# Patient Record
Sex: Male | Born: 1970
Health system: Southern US, Community
[De-identification: ages and names within clinical notes are randomized; demographics above are authoritative.]

## PROBLEM LIST (undated history)

## (undated) ENCOUNTER — Emergency Department (HOSPITAL_COMMUNITY): Admission: EM | Payer: BC Managed Care – PPO

## (undated) DIAGNOSIS — D689 Coagulation defect, unspecified: Secondary | ICD-10-CM

## (undated) DIAGNOSIS — I10 Essential (primary) hypertension: Secondary | ICD-10-CM

## (undated) DIAGNOSIS — E785 Hyperlipidemia, unspecified: Secondary | ICD-10-CM

## (undated) HISTORY — DX: Essential (primary) hypertension: I10

## (undated) HISTORY — DX: Hyperlipidemia, unspecified: E78.5

## (undated) HISTORY — DX: Coagulation defect, unspecified: D68.9

## (undated) HISTORY — PX: BICEPS TENDON REPAIR: SHX566

---

## 2000-04-18 ENCOUNTER — Encounter: Admission: RE | Admit: 2000-04-18 | Discharge: 2000-04-18 | Payer: Self-pay | Admitting: Family Medicine

## 2000-04-18 ENCOUNTER — Encounter: Payer: Self-pay | Admitting: Family Medicine

## 2001-10-01 HISTORY — PX: KNEE SURGERY: SHX244

## 2003-03-29 ENCOUNTER — Encounter: Payer: Self-pay | Admitting: Emergency Medicine

## 2003-03-29 ENCOUNTER — Emergency Department (HOSPITAL_COMMUNITY): Admission: EM | Admit: 2003-03-29 | Discharge: 2003-03-29 | Payer: Self-pay | Admitting: Emergency Medicine

## 2007-10-22 ENCOUNTER — Ambulatory Visit: Payer: Self-pay | Admitting: Cardiology

## 2007-10-24 ENCOUNTER — Ambulatory Visit: Payer: Self-pay

## 2007-11-18 ENCOUNTER — Encounter: Payer: Self-pay | Admitting: Cardiology

## 2007-11-18 ENCOUNTER — Ambulatory Visit: Payer: Self-pay

## 2011-02-13 NOTE — Assessment & Plan Note (Signed)
[facility]                            CARDIOLOGY OFFICE NOTE   NAME:[name], [name]                      MRN:          [id number]  DATE:10/22/2007                            DOB:          [birth year]    PRIMARY:  Montey Hora, PA at [facility].   REASON FOR PRESENTATION:  Evaluate the patient with chest discomfort and  hypertension.   HISTORY OF PRESENT ILLNESS:  The patient is a pleasant 40 year old  gentleman with an episode of chest discomfort.  This happened a couple  of weeks ago while he was hand-digging a hole on his job.  This is the  most exerting thing he does routinely.  He has not since done that kind  of activity.  He developed substernal chest pressure.  He stopped what  he was doing.  He said the discomfort slowly eased, but he did have some  persistent uncomfortable feeling for about a day.  He has since noticed  a little soreness perhaps when he is doing activities.  He said the  discomfort was about 7/10.  It was not like his previous reflux.  There  was no radiation to his jaw or to his arms.  He was a little light-  headed, but did not describe any excessive shortness of breath or  diaphoresis.  He had no palpitations, presyncope, or syncope.  He denies  any resting shortness of breath or resting complaints.  He has had no  PND or orthopnea.  He does get some arm tingling depending on how he  lies at night.  He is otherwise not exerting himself other than less  intense activities at work.   PAST MEDICAL HISTORY:  He has no history of hypertension (although  readings have been elevated recently).  Diabetes.  Hyperlipidemia.   PAST SURGICAL HISTORY:  None.   ALLERGIES:  None.   MEDICATIONS:  Prilosec.   SOCIAL HISTORY:  The patient is a Copywriter, advertising for [employer].  He is  married.  He has 2 young children.  He does not smoke cigarettes or  drink alcohol.   FAMILY HISTORY:  Contributory for his father  having a myocardial  infarction at age 79.   REVIEW OF SYSTEMS:  As stated in the HPI and positive for reflux.  Negative for other systems.   PHYSICAL EXAMINATION:  The patient is in no distress.  Blood pressure 158/110, heart rate 71 and regular.  Weight 257 pounds,  body mass index 39.  HEENT:  Eyelids unremarkable.  Pupils are equal, round, and reactive to  light and accommodation.  Fundi are within normal limits.  Oral mucosa  unremarkable.  NECK:  No jugular venous distension at 45 degrees, carotid upstroke  brisk and symmetric, no bruits, thyromegaly.  LYMPHATICS:  No cervical, axillary, or inguinal adenopathy.  LUNGS:  Clear to auscultation bilaterally.  BACK:  No costovertebral angle tenderness.  CHEST:  Unremarkable.  HEART:  PMI not displaced or sustained, S1 and S2 within normal limits,  no S3, no S4, no clicks, rubs, murmurs.  ABDOMEN:  Obese, positive bowel sounds,  normal in frequency and pitch,  no bruits, rebound, guarding.  No midline pulsatile masses,  hepatomegaly, splenomegaly.  SKIN:  No rashes, no nodules.  EXTREMITIES:  With 2+ pulses throughout, no edema, cyanosis, clubbing.  NEURO:  Oriented to person, place, and time, cranial nerves 2-12 grossly  intact, motor grossly intact.   EKG shows right bundle branch block (present since 2006), axis within  normal limits, sinus rhythm, no acute ST-T wave changes.   ASSESSMENT AND PLAN:  1. Chest discomfort.  The patient had chest discomfort that was      worrisome for exertional angina.  He does have a strong family      history.  His physical exam is unremarkable.  His EKG shows chronic      right bundle branch block.  At this point, I think the pre-test      probability of obstructive coronary artery disease is somewhat      moderate.  Therefore, he needs screening with exercise perfusion      study.  Further evaluation will be based on these results.  2. Hypertension.  I looked back at multiple blood  pressures, which      have been elevated over the past 1 year to 18 months.  I am going      to go ahead and give him hydrochlorothiazide 12.5 mg daily.  We      discussed increasing potassium intake.  He will get a BMET in 2      weeks.  We have discussed salt restriction and weight loss.  3. Obesity.  We discussed the need to lose weight with diet and      exercise.  4. Right bundle branch block.  This is chronic.  Will screen him for      obstructive coronary disease.  Otherwise, I would not suspect      structural heart disease.  No further workup will be needed.  5. Followup.  I will see him back based on the results of the above.     Rollene Rotunda, MD, [facility]  Electronically Signed    JH/MedQ  DD: 10/22/2007  DT: 10/22/2007  Job #: [id number]   cc:   Montey Hora, PA

## 2012-03-10 ENCOUNTER — Encounter (INDEPENDENT_AMBULATORY_CARE_PROVIDER_SITE_OTHER): Payer: BC Managed Care – PPO | Admitting: Ophthalmology

## 2012-03-10 DIAGNOSIS — H35719 Central serous chorioretinopathy, unspecified eye: Secondary | ICD-10-CM

## 2012-03-10 DIAGNOSIS — H43819 Vitreous degeneration, unspecified eye: Secondary | ICD-10-CM

## 2012-03-31 ENCOUNTER — Encounter (INDEPENDENT_AMBULATORY_CARE_PROVIDER_SITE_OTHER): Payer: BC Managed Care – PPO | Admitting: Ophthalmology

## 2012-03-31 DIAGNOSIS — H43819 Vitreous degeneration, unspecified eye: Secondary | ICD-10-CM

## 2012-03-31 DIAGNOSIS — H35719 Central serous chorioretinopathy, unspecified eye: Secondary | ICD-10-CM

## 2012-03-31 DIAGNOSIS — H2510 Age-related nuclear cataract, unspecified eye: Secondary | ICD-10-CM

## 2012-07-07 ENCOUNTER — Encounter (INDEPENDENT_AMBULATORY_CARE_PROVIDER_SITE_OTHER): Payer: BC Managed Care – PPO | Admitting: Ophthalmology

## 2013-04-07 ENCOUNTER — Other Ambulatory Visit: Payer: Self-pay

## 2013-04-07 MED ORDER — AMLODIPINE BESYLATE 10 MG PO TABS: 10.0000 mg | ORAL_TABLET | Freq: Every day | ORAL | Status: DC

## 2013-04-28 ENCOUNTER — Ambulatory Visit (INDEPENDENT_AMBULATORY_CARE_PROVIDER_SITE_OTHER): Payer: BC Managed Care – PPO | Admitting: Physician Assistant

## 2013-04-28 ENCOUNTER — Encounter: Payer: Self-pay | Admitting: Physician Assistant

## 2013-04-28 VITALS — BP 131/84 | HR 53 | Temp 97.5°F | Wt 265.4 lb

## 2013-04-28 DIAGNOSIS — I10 Essential (primary) hypertension: Secondary | ICD-10-CM | POA: Insufficient documentation

## 2013-04-28 DIAGNOSIS — Z23 Encounter for immunization: Secondary | ICD-10-CM

## 2013-04-28 MED ORDER — AMLODIPINE BESYLATE 10 MG PO TABS: 10.0000 mg | ORAL_TABLET | Freq: Every day | ORAL | Status: DC

## 2013-04-28 NOTE — Patient Instructions (Signed)

## 2013-04-28 NOTE — Progress Notes (Signed)
Subjective:     Patient ID: [name] [name], male   DOB: [birth year], 42 y.o.   MRN: [id number]  HPI Pt here for review of HTN States he has been doing well and taking meds on regular basis No CP, SOB, or lower ext edema No change in endurance   Review of Systems  All other systems reviewed and are negative.       Objective:   Physical Exam  Vitals reviewed. Oral- no lesions No JVD/Bruits Heart- RRR w/o M Lungs- CTA bilat No lower ext edema Full labs pending     Assessment:     HTN    Plan:     Pt with baseline EKG Will inform of lab results Would like him to decrease his weight Meds rf for 6 months Tetanus updated today

## 2013-04-29 LAB — BMP8+EGFR
BUN/Creatinine Ratio: 13 (ref 9–20)
BUN: 15 mg/dL (ref 6–24)
CO2: 24 mmol/L (ref 18–29)
Calcium: 9.9 mg/dL (ref 8.7–10.2)
Chloride: 104 mmol/L (ref 97–108)
Creatinine, Ser: 1.18 mg/dL (ref 0.76–1.27)
GFR calc Af Amer: 87 mL/min/{1.73_m2} (ref >59)
GFR calc non Af Amer: 75 mL/min/{1.73_m2} (ref >59)
Glucose: 85 mg/dL (ref 65–99)
Potassium: 4.4 mmol/L (ref 3.5–5.2)
Sodium: 141 mmol/L (ref 134–144)

## 2013-04-29 LAB — LIPID PANEL
Chol/HDL Ratio: 5.1 ratio units — ABNORMAL HIGH (ref 0.0–5.0)
Cholesterol, Total: 183 mg/dL (ref 100–199)
HDL: 36 mg/dL — ABNORMAL LOW (ref >39)
LDL Calculated: 119 mg/dL — ABNORMAL HIGH (ref 0–99)
Triglycerides: 139 mg/dL (ref 0–149)
VLDL Cholesterol Cal: 28 mg/dL (ref 5–40)

## 2013-04-29 LAB — HEPATIC FUNCTION PANEL
ALT: 22 IU/L (ref 0–44)
AST: 17 IU/L (ref 0–40)
Albumin: 4.4 g/dL (ref 3.5–5.5)
Alkaline Phosphatase: 58 IU/L (ref 39–117)
Bilirubin, Direct: 0.09 mg/dL (ref 0.00–0.40)
Total Bilirubin: 0.4 mg/dL (ref 0.0–1.2)
Total Protein: 6.8 g/dL (ref 6.0–8.5)

## 2013-05-27 ENCOUNTER — Other Ambulatory Visit: Payer: Self-pay

## 2013-05-27 DIAGNOSIS — R0683 Snoring: Secondary | ICD-10-CM

## 2013-06-14 ENCOUNTER — Ambulatory Visit: Payer: BC Managed Care – PPO | Attending: Family Medicine | Admitting: Sleep Medicine

## 2013-06-14 DIAGNOSIS — Z6841 Body Mass Index (BMI) 40.0 and over, adult: Secondary | ICD-10-CM | POA: Insufficient documentation

## 2013-06-14 DIAGNOSIS — R0683 Snoring: Secondary | ICD-10-CM

## 2013-06-14 DIAGNOSIS — G4733 Obstructive sleep apnea (adult) (pediatric): Secondary | ICD-10-CM | POA: Insufficient documentation

## 2013-06-20 NOTE — Procedures (Signed)
[facility] Miquel Stacks A. Gerilyn Pilgrim, MD     [email or web address]        [name], [name] [name]             ACCOUNT NO.:  [id number]  MEDICAL RECORD NO.:  [id number]          PATIENT TYPE:  OUT  LOCATION:  SLEEP LAB                     FACILITY:  [facility]  PHYSICIAN:  Travaughn Vue A. Gerilyn Pilgrim, M.D. DATE OF BIRTH:  [birth year]  DATE OF STUDY:  06/14/2013                           NOCTURNAL POLYSOMNOGRAM  REFERRING PHYSICIAN:  Ernestina Penna, M.D.  INDICATION FOR STUDY:  A 42 year old presents with fatigue and snoring. The study is being done to evaluate for obstructive sleep apnea syndrome.   MEDICATIONS:  Amlodipine.  EPWORTH SLEEPINESS SCALE:  6.  BMI:  40.  SLEEP ARCHITECTURE:  The total recording time is 407 minutes.  Sleep efficiency 96%.  Sleep latency 7 minutes.  REM latency 58 minutes. Stage N1 3%, N2 47%, N3 70%, and REM sleep 33%.  RESPIRATORY DATA:  Baseline oxygen saturation is 97, lowest saturation 85 during REM sleep.  Diagnostic AHI is 10 and RDI is 17.  LIMB MOVEMENT SUMMARY:  PLM index 0.  ELECTROCARDIOGRAM SUMMARY:  Average heart rate is 53.  IMPRESSION:  Mild-to-moderate obstructive sleep apnea syndrome.  RECOMMENDATION:  Formal CPAP titration study.  Thanks for this referral.     Waseem Suess A. Gerilyn Pilgrim, M.D.    KAD/MEDQ  D:  06/20/2013 19:33:06  T:  06/20/2013 19:50:01  Job:  [id number]

## 2013-12-21 ENCOUNTER — Other Ambulatory Visit: Payer: Self-pay | Admitting: Physician Assistant

## 2013-12-22 NOTE — Telephone Encounter (Signed)
Patient NTBS for follow up and lab work in order to get future refills

## 2014-03-29 ENCOUNTER — Other Ambulatory Visit: Payer: Self-pay | Admitting: Nurse Practitioner

## 2014-03-30 NOTE — Telephone Encounter (Signed)
Last seen 04/28/14 [facility]

## 2014-03-30 NOTE — Telephone Encounter (Signed)
Pt needs appt May rf x 1

## 2014-05-05 ENCOUNTER — Other Ambulatory Visit: Payer: Self-pay | Admitting: Physician Assistant

## 2014-05-10 ENCOUNTER — Encounter (INDEPENDENT_AMBULATORY_CARE_PROVIDER_SITE_OTHER): Payer: Self-pay

## 2014-05-10 ENCOUNTER — Encounter: Payer: Self-pay | Admitting: Nurse Practitioner

## 2014-05-10 ENCOUNTER — Encounter: Payer: BC Managed Care – PPO | Admitting: Nurse Practitioner

## 2014-05-10 LAB — POCT URINALYSIS DIPSTICK
BILIRUBIN UA: NEGATIVE
GLUCOSE UA: NEGATIVE
Ketones, UA: NEGATIVE
LEUKOCYTES UA: NEGATIVE
NITRITE UA: NEGATIVE
Protein, UA: NEGATIVE
Spec Grav, UA: 1.025
UROBILINOGEN UA: NEGATIVE
pH, UA: 5

## 2014-05-10 NOTE — Progress Notes (Addendum)
   Subjective:    Patient ID: [name] FullingJeffrey [name], male    DOB: Oct 12, 1970, 43 y.o.   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    Review of Systems     Objective:   Physical Exam        Assessment & Plan:  Erroneous encounter

## 2014-05-14 ENCOUNTER — Encounter: Payer: Self-pay | Admitting: Nurse Practitioner

## 2014-05-14 ENCOUNTER — Ambulatory Visit (INDEPENDENT_AMBULATORY_CARE_PROVIDER_SITE_OTHER): Payer: BC Managed Care – PPO | Admitting: Nurse Practitioner

## 2014-05-14 VITALS — BP 138/87 | HR 77 | Temp 97.8°F | Ht 69.0 in | Wt 270.0 lb

## 2014-05-14 DIAGNOSIS — Z0289 Encounter for other administrative examinations: Secondary | ICD-10-CM

## 2014-05-14 DIAGNOSIS — G4733 Obstructive sleep apnea (adult) (pediatric): Secondary | ICD-10-CM | POA: Insufficient documentation

## 2014-05-14 NOTE — Progress Notes (Signed)
Commercial Driver Medical Examination   [name] [name] is a 43 y.o. male who presents today for a commercial driver fitness determination physical exam. The patient reports no problems. The following portions of the patient's history were reviewed and updated as appropriate: allergies, current medications, past family history, past medical history, past social history, past surgical history and problem list.  Illness or injury last 5 years? no Head/Brain injuries, disorders or illnesses? no Seizures? no Eye disorders or impaired vision?no Ear disorders or loss of hearing or balance?no Heart disease, heart attack or other cardiovascular conditions?no Heart surgery ( valve replacement, bypass or angioplasty?no Pacemaker?no High blood pressure?yes Muscular disease?no Shortness of breath?no Lung disease, emphysema, asthma, chronic bronchitis?no Kidney disease, dialysis?no Liver disease?no Digestive problems?no Diabetes?no Nervous or psychiatric disorder/depression?no Loss of ,or altered mental status?no Fainting/ dizziness?no Sleep disorder, loud snoring, daytime sleepiness?yes Sleep apnea with 99% usage Stroke or paralysis?no Missing or impaired hand, arm, foot, leg fingers or toes?no Spinal injury or disease?no Chronic low back pain?no Regular or frequent alcohol use?no Narcotic or habit forming drug use?no  Outpatient Encounter Prescriptions as of 05/14/2014  Medication Sig  . amLODipine (NORVASC) 10 MG tablet TAKE 1 TABLET (10 MG TOTAL) BY MOUTH DAILY.    Review of Systems A comprehensive review of systems was negative.   Objective:    Vision:  Uncorrected Corrected Horizontal Field of Vision  Right Eye  20/20 >70 degrees  Left Eye   20/20 >70 degrees  Both Eyes   20/20    Applicant can recognize and distinguish among traffic control signals and devices showing standard red, green, and amber colors.  Applicant meets visual acuity requirement only when wearing  corrective lenses.  Monocular Vision?: No   Hearing:Positive whisper test at >5 ft   BP 138/87  Pulse 77  Temp(Src) 97.8 F (36.6 C) (Oral)  Ht 5\' 9"  (1.753 m)  Wt 270 lb (122.471 kg)  BMI 39.85 kg/m2  General Appearance:    Alert, cooperative, no distress, appears stated age  Head:    Normocephalic, without obvious abnormality, atraumatic  Eyes:    PERRL, conjunctiva/corneas clear, EOM's intact, fundi    benign, both eyes       Ears:    Normal TM's and external ear canals, both ears  Nose:   Nares normal, septum midline, mucosa normal, no drainage    or sinus tenderness  Throat:   Lips, mucosa, and tongue normal; teeth and gums normal  Neck:   Supple, symmetrical, trachea midline, no adenopathy;       thyroid:  No enlargement/tenderness/nodules; no carotid   bruit or JVD  Back:     Symmetric, no curvature, ROM normal, no CVA tenderness  Lungs:     Clear to auscultation bilaterally, respirations unlabored  Chest wall:    No tenderness or deformity  Heart:    Regular rate and rhythm, S1 and S2 normal, no murmur, rub   or gallop  Abdomen:     Soft, non-tender, bowel sounds active all four quadrants,    no masses, no organomegaly  Genitalia:    Normal male without lesion, discharge or tenderness  Rectal:    Normal tone, normal prostate, no masses or tenderness;   guaiac negative stool  Extremities:   Extremities normal, atraumatic, no cyanosis or edema  Pulses:   2+ and symmetric all extremities  Skin:   Skin color, texture, turgor normal, no rashes or lesions  Lymph nodes:   Cervical, supraclavicular, and axillary nodes normal  Neurologic:   CNII-XII intact. Normal strength, sensation and reflexes      throughout    Labs: Lab Results  Component Value Date   SPECGRAV 1.025 05/10/2014   PROTEINUR neg 05/10/2014   BILIRUBINUR neg 05/10/2014      Assessment:    Healthy male exam.  Meets standards, but periodic monitoring required due to Hypertension and sleep apnea.   Driver qualified only for 1 year.    Plan:    Medical examiners certificate completed and printed. Return as needed.   Mary-Margaret Daphine DeutscherMartin, FNP

## 2014-05-14 NOTE — Patient Instructions (Signed)
Medical Examiner's Certificate  I certify that I have examined [name] FullingJeffrey [name]. In accordance with the [employer] Safety Regulations [phone](49 CFR [id number]) and with knowledge of the driving duties, I find this person is qualified; and, if applicable, only when:     Wearing corrective lenses The information I have provided regarding this physical examination is true and complete. A complete examination form with any attachment embodies my findings completely and correctly, and is on file in my office.   _________________________________________ Bennie PieriniMARTIN,MARY MARGARET 05/14/2014  __________________________________________ _________[id number]__________ __[place]_  Signature of Driver Driver's License No. State    Address of Driver [id number] [place] [id number]  Medical Certificate Expiration Date 05/15/15

## 2014-06-07 ENCOUNTER — Other Ambulatory Visit: Payer: Self-pay | Admitting: Family Medicine

## 2014-12-16 ENCOUNTER — Other Ambulatory Visit: Payer: Self-pay | Admitting: Nurse Practitioner

## 2015-01-17 ENCOUNTER — Other Ambulatory Visit: Payer: Self-pay | Admitting: Nurse Practitioner

## 2015-01-17 NOTE — Telephone Encounter (Signed)
Has an appt with you this month MMM

## 2015-01-24 ENCOUNTER — Ambulatory Visit (INDEPENDENT_AMBULATORY_CARE_PROVIDER_SITE_OTHER): Payer: BLUE CROSS/BLUE SHIELD | Admitting: Nurse Practitioner

## 2015-01-24 ENCOUNTER — Encounter: Payer: Self-pay | Admitting: Nurse Practitioner

## 2015-01-24 VITALS — BP 123/83 | HR 64 | Temp 97.2°F | Ht 69.0 in | Wt 278.0 lb

## 2015-01-24 DIAGNOSIS — I10 Essential (primary) hypertension: Secondary | ICD-10-CM | POA: Diagnosis not present

## 2015-01-24 MED ORDER — AMLODIPINE BESYLATE 10 MG PO TABS: ORAL_TABLET | ORAL | Status: DC

## 2015-01-24 NOTE — Progress Notes (Signed)
   Subjective:    Patient ID: [name] [name], male    DOB: March 08, 1971, 44 y.o.   MRN: [id number]  HPI Patient in today to recheck hypertension- His sister had taken his blood pressure and it was 160 /90. 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 He is feeling good.    Review of Systems  Constitutional: Negative for fever and chills.  HENT: Negative.   Respiratory: Negative for cough, chest tightness and shortness of breath.   Cardiovascular: Negative for chest pain, palpitations and leg swelling.  Gastrointestinal: Negative.   Genitourinary: Negative.   Neurological: Negative.   Psychiatric/Behavioral: Negative.   All other systems reviewed and are negative.      Objective:   Physical Exam  Constitutional: He is oriented to person, place, and time. He appears well-developed and well-nourished.  HENT:  Head: Normocephalic.  Right Ear: External ear normal.  Left Ear: External ear normal.  Nose: Nose normal.  Mouth/Throat: Oropharynx is clear and moist.  Eyes: EOM are normal. Pupils are equal, round, and reactive to light.  Neck: Normal range of motion. Neck supple. No JVD present. No thyromegaly present.  Cardiovascular: Normal rate, regular rhythm, normal heart sounds and intact distal pulses.  Exam reveals no gallop and no friction rub.   No murmur heard. Pulmonary/Chest: Effort normal and breath sounds normal. No respiratory distress. He has no wheezes. He has no rales. He exhibits no tenderness.  Abdominal: Soft. Bowel sounds are normal. He exhibits no mass. There is no tenderness.  Musculoskeletal: Normal range of motion. He exhibits no edema.  Lymphadenopathy:    He has no cervical adenopathy.  Neurological: He is alert and oriented to person, place, and time. No cranial nerve deficit.  Skin: Skin is warm and dry.  Psychiatric: He has a normal mood and affect. His behavior is normal. Judgment and  thought content normal.    BP 123/83 mmHg  Pulse 64  Temp(Src) 97.2 F (36.2 C) (Oral)  Ht $R'5\' 9"'Wp$  (1.753 m)  Wt 278 lb (126.1 kg)  BMI 41.03 kg/m2       Assessment & Plan:  1. Essential hypertension do not add slat to diet Keep diary of blood pressures - CMP14+EGFR - NMR, lipoprofile - amLODipine (NORVASC) 10 MG tablet; TAKE 1 TABLET (10 MG TOTAL) BY MOUTH DAILY. MUST BE SEEN  Dispense: 30 tablet; Refill: 5    Labs pending Health maintenance reviewed Diet and exercise encouraged Continue all meds Follow up  In 1 month   Young Harris, FNP

## 2015-01-24 NOTE — Patient Instructions (Signed)

## 2015-01-25 ENCOUNTER — Other Ambulatory Visit: Payer: Self-pay | Admitting: Nurse Practitioner

## 2015-01-25 LAB — CMP14+EGFR
A/G RATIO: 1.8 (ref 1.1–2.5)
ALT: 23 IU/L (ref 0–44)
AST: 14 IU/L (ref 0–40)
Albumin: 4.5 g/dL (ref 3.5–5.5)
Alkaline Phosphatase: 59 IU/L (ref 39–117)
BILIRUBIN TOTAL: 0.3 mg/dL (ref 0.0–1.2)
BUN / CREAT RATIO: 14 (ref 9–20)
BUN: 14 mg/dL (ref 6–24)
CALCIUM: 9.3 mg/dL (ref 8.7–10.2)
CHLORIDE: 105 mmol/L (ref 97–108)
CO2: 24 mmol/L (ref 18–29)
CREATININE: 0.99 mg/dL (ref 0.76–1.27)
GFR calc Af Amer: 107 mL/min/{1.73_m2} (ref >59)
GFR calc non Af Amer: 92 mL/min/{1.73_m2} (ref >59)
GLOBULIN, TOTAL: 2.5 g/dL (ref 1.5–4.5)
Glucose: 85 mg/dL (ref 65–99)
POTASSIUM: 4.7 mmol/L (ref 3.5–5.2)
Sodium: 143 mmol/L (ref 134–144)
TOTAL PROTEIN: 7 g/dL (ref 6.0–8.5)

## 2015-01-25 LAB — NMR, LIPOPROFILE
Cholesterol: 174 mg/dL (ref 100–199)
HDL Cholesterol by NMR: 35 mg/dL — ABNORMAL LOW (ref >39)
HDL Particle Number: 29.4 umol/L — ABNORMAL LOW (ref >=30.5)
LDL PARTICLE NUMBER: 1566 nmol/L — AB (ref <1000)
LDL SIZE: 20.1 nm (ref >20.5)
LDL-C: 113 mg/dL — ABNORMAL HIGH (ref 0–99)
LP-IR Score: 89 — ABNORMAL HIGH (ref <=45)
SMALL LDL PARTICLE NUMBER: 1000 nmol/L — AB (ref <=527)
Triglycerides by NMR: 131 mg/dL (ref 0–149)

## 2015-01-25 MED ORDER — ATORVASTATIN CALCIUM 40 MG PO TABS: 40.0000 mg | ORAL_TABLET | Freq: Every day | ORAL | Status: DC

## 2015-02-24 ENCOUNTER — Ambulatory Visit (INDEPENDENT_AMBULATORY_CARE_PROVIDER_SITE_OTHER): Payer: BLUE CROSS/BLUE SHIELD | Admitting: Nurse Practitioner

## 2015-02-24 ENCOUNTER — Encounter: Payer: Self-pay | Admitting: Nurse Practitioner

## 2015-02-24 VITALS — BP 116/78 | HR 60 | Temp 97.4°F | Ht 69.0 in | Wt 284.0 lb

## 2015-02-24 DIAGNOSIS — I10 Essential (primary) hypertension: Secondary | ICD-10-CM | POA: Diagnosis not present

## 2015-02-24 MED ORDER — AMLODIPINE BESY-BENAZEPRIL HCL 5-20 MG PO CAPS: 1.0000 | ORAL_CAPSULE | Freq: Every day | ORAL | Status: DC

## 2015-02-24 NOTE — Patient Instructions (Signed)

## 2015-02-24 NOTE — Progress Notes (Signed)
   Subjective:    Patient ID: [name] [name], male    DOB: 1971-03-05, 44 y.o.   MRN: [id number]  HPI Patient was seen 01/24/15 with hypertension and was started on amlodipine. Blood pressure at home has been running from 120-150'2 systolic. He denies in side effects from medication.    Review of Systems  Constitutional: Negative.   HENT: Negative.   Respiratory: Negative.   Cardiovascular: Negative.   Gastrointestinal: Negative.   Genitourinary: Negative.   Neurological: Negative.  Negative for headaches.  Psychiatric/Behavioral: Negative.   All other systems reviewed and are negative.      Objective:   Physical Exam  Constitutional: He is oriented to person, place, and time. He appears well-developed and well-nourished. No distress.  Cardiovascular: Normal rate, regular rhythm and normal heart sounds.   Pulmonary/Chest: Effort normal and breath sounds normal.  Neurological: He is alert and oriented to person, place, and time.  Skin: Skin is warm.  Psychiatric: He has a normal mood and affect. His behavior is normal. Judgment and thought content normal.    BP 116/78 mmHg  Pulse 60  Temp(Src) 97.4 F (36.3 C) (Oral)  Ht 5\' 9"  (1.753 m)  Wt 284 lb (128.822 kg)  BMI 41.92 kg/m2       Assessment & Plan:  1. Essential hypertension Do not add slat to diet Keep diary of blood pressures DOT physical in 1 month - amLODipine-benazepril (LOTREL) 5-20 MG per capsule; Take 1 capsule by mouth daily.  Dispense: 30 capsule; Refill: 5  Mary-Margaret Daphine DeutscherMartin, FNP

## 2015-04-12 ENCOUNTER — Ambulatory Visit: Payer: BLUE CROSS/BLUE SHIELD | Admitting: Nurse Practitioner

## 2015-04-21 ENCOUNTER — Ambulatory Visit (INDEPENDENT_AMBULATORY_CARE_PROVIDER_SITE_OTHER): Payer: BLUE CROSS/BLUE SHIELD | Admitting: Nurse Practitioner

## 2015-04-21 ENCOUNTER — Encounter: Payer: Self-pay | Admitting: Nurse Practitioner

## 2015-04-21 VITALS — BP 116/76 | HR 60 | Temp 97.1°F | Ht 69.0 in | Wt 279.0 lb

## 2015-04-21 DIAGNOSIS — I10 Essential (primary) hypertension: Secondary | ICD-10-CM

## 2015-04-21 NOTE — Patient Instructions (Signed)

## 2015-04-21 NOTE — Progress Notes (Signed)
   Subjective:    Patient ID: [name] [name], male    DOB: 05/29/1971, 44 y.o.   MRN: [id number]  HPI Patient was seen 5//26/16 with elevated blood pressure- at time he was on amlodipine- blood pressure was still running high so we changed him to lotrel 5/20. Patient says that he is feeling fine. No side effects from medication.    Review of Systems  Constitutional: Negative.   HENT: Negative.   Respiratory: Negative.   Cardiovascular: Negative.   Gastrointestinal: Negative.   Genitourinary: Negative.   Neurological: Negative.   Psychiatric/Behavioral: Negative.   All other systems reviewed and are negative.      Objective:   Physical Exam  Constitutional: He is oriented to person, place, and time. He appears well-developed and well-nourished.  HENT:  Head: Normocephalic.  Right Ear: External ear normal.  Left Ear: External ear normal.  Nose: Nose normal.  Mouth/Throat: Oropharynx is clear and moist.  Eyes: EOM are normal. Pupils are equal, round, and reactive to light.  Neck: Normal range of motion. Neck supple. No JVD present. No thyromegaly present.  Cardiovascular: Normal rate, regular rhythm, normal heart sounds and intact distal pulses.  Exam reveals no gallop and no friction rub.   No murmur heard. Pulmonary/Chest: Effort normal and breath sounds normal. No respiratory distress. He has no wheezes. He has no rales. He exhibits no tenderness.  Abdominal: Soft. Bowel sounds are normal. He exhibits no mass. There is no tenderness.  Genitourinary: Prostate normal and penis normal.  Musculoskeletal: Normal range of motion. He exhibits no edema.  Lymphadenopathy:    He has no cervical adenopathy.  Neurological: He is alert and oriented to person, place, and time. No cranial nerve deficit.  Skin: Skin is warm and dry.  Psychiatric: He has a normal mood and affect. His behavior is normal. Judgment and thought content normal.   BP 116/76 mmHg  Pulse 60  Temp(Src) 97.1 F  (36.2 C) (Oral)  Ht  (1.753 m)  Wt 279 lb (126.554 kg)  BMI 41.18 kg/m2        Assessment & Plan:   1. Essential hypertension    Continue current meds Recheck in 3 months  Mary-Margaret Daphine Deutscher, FNP

## 2015-05-02 ENCOUNTER — Ambulatory Visit (INDEPENDENT_AMBULATORY_CARE_PROVIDER_SITE_OTHER): Payer: BLUE CROSS/BLUE SHIELD | Admitting: Nurse Practitioner

## 2015-05-02 ENCOUNTER — Encounter: Payer: Self-pay | Admitting: Nurse Practitioner

## 2015-05-02 VITALS — BP 125/87 | HR 66 | Temp 96.8°F | Ht 69.0 in | Wt 275.4 lb

## 2015-05-02 DIAGNOSIS — Z024 Encounter for examination for driving license: Secondary | ICD-10-CM

## 2015-05-02 LAB — POCT URINALYSIS DIPSTICK
BILIRUBIN UA: NEGATIVE
Blood, UA: NEGATIVE
Glucose, UA: NEGATIVE
Ketones, UA: NEGATIVE
LEUKOCYTES UA: NEGATIVE
NITRITE UA: NEGATIVE
PROTEIN UA: NEGATIVE
SPEC GRAV UA: 1.015
Urobilinogen, UA: NEGATIVE
pH, UA: 6

## 2015-05-02 NOTE — Progress Notes (Signed)
DOT physical see attached physical

## 2015-07-18 ENCOUNTER — Encounter: Payer: Self-pay | Admitting: Family Medicine

## 2015-07-18 ENCOUNTER — Ambulatory Visit (INDEPENDENT_AMBULATORY_CARE_PROVIDER_SITE_OTHER): Payer: BLUE CROSS/BLUE SHIELD | Admitting: Family Medicine

## 2015-07-18 VITALS — BP 120/81 | HR 76 | Temp 99.4°F | Ht 69.0 in | Wt 282.6 lb

## 2015-07-18 DIAGNOSIS — H66002 Acute suppurative otitis media without spontaneous rupture of ear drum, left ear: Secondary | ICD-10-CM

## 2015-07-18 DIAGNOSIS — J069 Acute upper respiratory infection, unspecified: Secondary | ICD-10-CM | POA: Diagnosis not present

## 2015-07-18 LAB — POCT INFLUENZA A/B
Influenza A, POC: NEGATIVE
Influenza B, POC: NEGATIVE

## 2015-07-18 MED ORDER — MOMETASONE FUROATE 50 MCG/ACT NA SUSP: 1.0000 | Freq: Two times a day (BID) | NASAL | Status: DC

## 2015-07-18 MED ORDER — AZITHROMYCIN 250 MG PO TABS: ORAL_TABLET | ORAL | Status: DC

## 2015-07-18 NOTE — Progress Notes (Signed)
BP 120/81 mmHg  Pulse 76  Temp(Src) 99.4 F (37.4 C) (Oral)  Ht  (1.753 m)  Wt 282 lb 9.6 oz (128.187 kg)  BMI 41.71 kg/m2   Subjective:    Patient ID: [name] [name], male    DOB: 06-Jul-1971, 44 y.o.   MRN: [id number]  HPI: [name] [name] is a 44 y.o. male presenting on 07/18/2015 for Cough; chest congestion; and sinus congestion   HPI Congestion and cough Patient has a one-week history of nasal congestion, sinus pressure, cough, sore throat, chills and coughing spells. He denies any fevers but does admit to having chills. Started with nasal congestion and postnasal drainage and then progressed from there. He denies any sick contacts or any major seasonal allergies. He has not had his flu shot yet this year.  Relevant past medical, surgical, family and social history reviewed and updated as indicated. Interim medical history since our last visit reviewed. Allergies and medications reviewed and updated.  Review of Systems  Constitutional: Negative for fever and chills.  HENT: Positive for congestion, ear pain, postnasal drip, rhinorrhea, sinus pressure, sneezing and sore throat. Negative for ear discharge and voice change.   Eyes: Negative for pain, discharge, redness and visual disturbance.  Respiratory: Positive for cough. Negative for chest tightness, shortness of breath and wheezing.   Cardiovascular: Negative for chest pain and leg swelling.  Gastrointestinal: Negative for abdominal pain, diarrhea and constipation.  Genitourinary: Negative for difficulty urinating.  Musculoskeletal: Negative for back pain and gait problem.  Skin: Negative for rash.  Neurological: Negative for syncope, light-headedness and headaches.  All other systems reviewed and are negative.   Per HPI unless specifically indicated above     Medication List       This list is accurate as of: 07/18/15 11:10 AM.  Always use your most recent med list.               amLODipine-benazepril  5-20 MG capsule  Commonly known as:  LOTREL  Take 1 capsule by mouth daily.     atorvastatin 40 MG tablet  Commonly known as:  LIPITOR  Take 1 tablet (40 mg total) by mouth daily.     dextromethorphan-guaiFENesin 30-600 MG 12hr tablet  Commonly known as:  MUCINEX DM  Take 1 tablet by mouth 2 (two) times daily.     mometasone 50 MCG/ACT nasal spray  Commonly known as:  NASONEX  Place 1 spray into the nose 2 (two) times daily.           Objective:    BP 120/81 mmHg  Pulse 76  Temp(Src) 99.4 F (37.4 C) (Oral)  Ht  (1.753 m)  Wt 282 lb 9.6 oz (128.187 kg)  BMI 41.71 kg/m2  Wt Readings from Last 3 Encounters:  07/18/15 282 lb 9.6 oz (128.187 kg)  05/02/15 275 lb 6.4 oz (124.921 kg)  04/21/15 279 lb (126.554 kg)    Physical Exam  Constitutional: He is oriented to person, place, and time. He appears well-developed and well-nourished. No distress.  HENT:  Right Ear: Tympanic membrane, external ear and ear canal normal.  Left Ear: External ear and ear canal normal. Tympanic membrane is injected, erythematous and bulging. Tympanic membrane is not scarred and not perforated. A middle ear effusion is present.  Nose: Mucosal edema and rhinorrhea present. No sinus tenderness. No epistaxis. Right sinus exhibits maxillary sinus tenderness. Right sinus exhibits no frontal sinus tenderness. Left sinus exhibits maxillary sinus tenderness. Left sinus exhibits no frontal  sinus tenderness.  Mouth/Throat: Uvula is midline and mucous membranes are normal. Posterior oropharyngeal edema and posterior oropharyngeal erythema present. No oropharyngeal exudate or tonsillar abscesses.  Eyes: Conjunctivae and EOM are normal. Pupils are equal, round, and reactive to light. Right eye exhibits no discharge. No scleral icterus.  Cardiovascular: Normal rate, regular rhythm, normal heart sounds and intact distal pulses.   No murmur heard. Pulmonary/Chest: Effort normal and breath sounds normal. No  respiratory distress. He has no wheezes.  Abdominal: He exhibits no distension.  Musculoskeletal: Normal range of motion. He exhibits no edema.  Neurological: He is alert and oriented to person, place, and time. Coordination normal.  Skin: Skin is warm and dry. No rash noted. He is not diaphoretic.  Psychiatric: He has a normal mood and affect. His behavior is normal.  Vitals reviewed.   Results for orders placed or performed in visit on 05/02/15  POCT urinalysis dipstick  Result Value Ref Range   Color, UA gold    Clarity, UA clear    Glucose, UA neg    Bilirubin, UA neg    Ketones, UA neg    Spec Grav, UA 1.015    Blood, UA neg    pH, UA 6.0    Protein, UA neg    Urobilinogen, UA negative    Nitrite, UA neg    Leukocytes, UA Negative Negative      Assessment & Plan:   Problem List Items Addressed This Visit    None    Visit Diagnoses    Acute upper respiratory infection    -  Primary    Relevant Medications    mometasone (NASONEX) 50 MCG/ACT nasal spray    azithromycin (ZITHROMAX) 250 MG tablet    Other Relevant Orders    POCT Influenza A/B    Acute suppurative otitis media of left ear without spontaneous rupture of tympanic membrane, recurrence not specified        Relevant Medications    azithromycin (ZITHROMAX) 250 MG tablet        Follow up plan: Return in about 3 months (around 10/18/2015), or if symptoms worsen or fail to improve, for BP and Cholesterol recheck.  Arville CareJoshua Karsyn Jamie, MD [facility] 07/18/2015, 11:10 AM

## 2015-07-27 ENCOUNTER — Other Ambulatory Visit: Payer: Self-pay | Admitting: Nurse Practitioner

## 2015-07-27 NOTE — Telephone Encounter (Signed)
Last seen 07/18/15  Dr Dettinger  Last lipid 01/24/15

## 2015-09-02 ENCOUNTER — Other Ambulatory Visit: Payer: Self-pay

## 2015-09-02 DIAGNOSIS — I10 Essential (primary) hypertension: Secondary | ICD-10-CM

## 2015-09-02 MED ORDER — AMLODIPINE BESY-BENAZEPRIL HCL 5-20 MG PO CAPS: 1.0000 | ORAL_CAPSULE | Freq: Every day | ORAL | Status: DC

## 2015-09-19 ENCOUNTER — Emergency Department (HOSPITAL_COMMUNITY)
Admission: EM | Admit: 2015-09-19 | Discharge: 2015-09-19 | Disposition: A | Discharge status: Home / Self Care | Payer: BLUE CROSS/BLUE SHIELD | Attending: Emergency Medicine | Admitting: Emergency Medicine

## 2015-09-19 ENCOUNTER — Emergency Department (HOSPITAL_COMMUNITY): Payer: BLUE CROSS/BLUE SHIELD

## 2015-09-19 ENCOUNTER — Encounter (HOSPITAL_COMMUNITY): Payer: Self-pay | Admitting: Emergency Medicine

## 2015-09-19 ENCOUNTER — Ambulatory Visit (INDEPENDENT_AMBULATORY_CARE_PROVIDER_SITE_OTHER): Payer: BLUE CROSS/BLUE SHIELD | Admitting: Pediatrics

## 2015-09-19 ENCOUNTER — Encounter: Payer: Self-pay | Admitting: Pediatrics

## 2015-09-19 VITALS — BP 129/83 | HR 78 | Temp 97.1°F | Ht 69.0 in | Wt 292.6 lb

## 2015-09-19 DIAGNOSIS — Z79899 Other long term (current) drug therapy: Secondary | ICD-10-CM | POA: Insufficient documentation

## 2015-09-19 DIAGNOSIS — E785 Hyperlipidemia, unspecified: Secondary | ICD-10-CM | POA: Diagnosis not present

## 2015-09-19 DIAGNOSIS — R0789 Other chest pain: Secondary | ICD-10-CM

## 2015-09-19 DIAGNOSIS — G4733 Obstructive sleep apnea (adult) (pediatric): Secondary | ICD-10-CM | POA: Diagnosis not present

## 2015-09-19 DIAGNOSIS — R079 Chest pain, unspecified: Secondary | ICD-10-CM

## 2015-09-19 DIAGNOSIS — R0600 Dyspnea, unspecified: Secondary | ICD-10-CM | POA: Insufficient documentation

## 2015-09-19 DIAGNOSIS — I10 Essential (primary) hypertension: Secondary | ICD-10-CM | POA: Insufficient documentation

## 2015-09-19 LAB — CBC WITH DIFFERENTIAL/PLATELET
BASOS ABS: 0 10*3/uL (ref 0.0–0.1)
BASOS PCT: 0 %
EOS PCT: 3 %
Eosinophils Absolute: 0.2 10*3/uL (ref 0.0–0.7)
HEMATOCRIT: 43 % (ref 39.0–52.0)
Hemoglobin: 14.7 g/dL (ref 13.0–17.0)
Lymphocytes Relative: 29 %
Lymphs Abs: 2.2 10*3/uL (ref 0.7–4.0)
MCH: 29.7 pg (ref 26.0–34.0)
MCHC: 34.2 g/dL (ref 30.0–36.0)
MCV: 86.9 fL (ref 78.0–100.0)
MONO ABS: 0.7 10*3/uL (ref 0.1–1.0)
Monocytes Relative: 9 %
NEUTROS ABS: 4.5 10*3/uL (ref 1.7–7.7)
Neutrophils Relative %: 59 %
PLATELETS: 186 10*3/uL (ref 150–400)
RBC: 4.95 MIL/uL (ref 4.22–5.81)
RDW: 13.6 % (ref 11.5–15.5)
WBC: 7.6 10*3/uL (ref 4.0–10.5)

## 2015-09-19 LAB — BASIC METABOLIC PANEL
Anion gap: 9 (ref 5–15)
BUN: 13 mg/dL (ref 6–20)
CALCIUM: 9.4 mg/dL (ref 8.9–10.3)
CHLORIDE: 104 mmol/L (ref 101–111)
CO2: 25 mmol/L (ref 22–32)
CREATININE: 1.11 mg/dL (ref 0.61–1.24)
Glucose, Bld: 92 mg/dL (ref 65–99)
Potassium: 4.2 mmol/L (ref 3.5–5.1)
SODIUM: 138 mmol/L (ref 135–145)

## 2015-09-19 LAB — D-DIMER, QUANTITATIVE (NOT AT ARMC): D DIMER QUANT: 0.52 ug{FEU}/mL — AB (ref 0.00–0.50)

## 2015-09-19 LAB — I-STAT TROPONIN, ED: TROPONIN I, POC: 0 ng/mL (ref 0.00–0.08)

## 2015-09-19 MED ORDER — IOHEXOL 350 MG/ML SOLN
125.0000 mL | Freq: Once | INTRAVENOUS | Status: AC | PRN
  Administered 2015-09-19: 125 mL via INTRAVENOUS

## 2015-09-19 MED ORDER — KETOROLAC TROMETHAMINE 30 MG/ML IJ SOLN
30.0000 mg | Freq: Once | INTRAMUSCULAR | Status: AC
  Administered 2015-09-19: 30 mg via INTRAVENOUS
  Filled 2015-09-19: qty 1

## 2015-09-19 MED ORDER — ASPIRIN 81 MG PO CHEW
324.0000 mg | CHEWABLE_TABLET | Freq: Once | ORAL | Status: AC
  Administered 2015-09-19: 324 mg via ORAL
  Filled 2015-09-19: qty 4

## 2015-09-19 NOTE — ED Provider Notes (Signed)
CSN: [id number]     Arrival date & time 09/19/15  1730 History   First MD Initiated Contact with Patient 09/19/15 1737     Chief Complaint  Patient presents with  . Chest Pain     (Consider location/radiation/quality/duration/timing/severity/associated sxs/prior Treatment) Patient is a 44 y.o. male presenting with chest pain. The history is provided by the patient.  Chest Pain He has been having dull, left-sided chest pain for the last 4 days. Pain waxes and wanes. He rates the pain at 3/10 currently, but will get as high as 6/10. It is worse if he presses on the left side of his stressed and somewhat worse if he takes a deep breath. It is significantly worse if he coughs. There is also some increase in pain with twisting and turning. 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 He does spend a large amount of time during the day in the truck but states that he does get out frequently. There is associated dyspnea and dyspnea slightly worse than his baseline. There is no associated nausea, vomiting, diaphoresis. He does have cardiac risk factors of obesity, hypertension, hyperlipidemia, family history of premature coronary atherosclerosis (father had first MI at age 45). He is a nonsmoker without history of diabetes. He does have obstructive sleep apnea and uses CPAP at night.  Past Medical History  Diagnosis Date  . Hypertension   . Hyperlipidemia    Past Surgical History  Procedure Laterality Date  . Knee surgery Left 2003  . Biceps tendon repair     Family History  Problem Relation Age of Onset  . Cancer Mother     breast cancer  . Heart attack Father    Social History  Substance Use Topics  . Smoking status: Never Smoker   . Smokeless tobacco: None  . Alcohol Use: No    Review of Systems  Cardiovascular: Positive for chest pain.  All other systems reviewed and are negative.     Allergies  Review of patient's allergies indicates no known allergies.  Home  Medications   Prior to Admission medications   Medication Sig Start Date End Date Taking? Authorizing Provider  amLODipine-benazepril (LOTREL) 5-20 MG capsule Take 1 capsule by mouth daily. 09/02/15   Elige RadonJoshua A Dettinger, MD  atorvastatin (LIPITOR) 40 MG tablet TAKE 1 TABLET (40 MG TOTAL) BY MOUTH DAILY. 07/27/15   Elige RadonJoshua A Dettinger, MD   There were no vitals taken for this visit. Physical Exam  Nursing note and vitals reviewed.  44 year old male, resting comfortably and in no acute distress. Vital signs are normal. Oxygen saturation is 99%, which is normal. Head is normocephalic and atraumatic. PERRLA, EOMI. Oropharynx is clear. Neck is nontender and supple without adenopathy or JVD. Back is nontender and there is no CVA tenderness. Lungs are clear without rales, wheezes, or rhonchi. Chest is mildly to moderately tender on the left side which does reproduce his pain. Heart has regular rate and rhythm without murmur. Abdomen is soft, flat, nontender without masses or hepatosplenomegaly and peristalsis is normoactive. Extremities have no cyanosis or edema, full range of motion is present. Skin is warm and dry without rash. Neurologic: Mental status is normal, cranial nerves are intact, there are no motor or sensory deficits.  ED Course  Procedures (including critical care time) Labs Review Results for orders placed or performed during the hospital encounter of 09/19/15  Basic metabolic panel  Result Value Ref Range   Sodium 138 135 -  145 mmol/L   Potassium 4.2 3.5 - 5.1 mmol/L   Chloride 104 101 - 111 mmol/L   CO2 25 22 - 32 mmol/L   Glucose, Bld 92 65 - 99 mg/dL   BUN 13 6 - 20 mg/dL   Creatinine, Ser 1.61 0.61 - 1.24 mg/dL   Calcium 9.4 8.9 - 09.6 mg/dL   GFR calc non Af Amer >60 >60 mL/min   GFR calc Af Amer >60 >60 mL/min   Anion gap 9 5 - 15  D-dimer, quantitative  Result Value Ref Range   D-Dimer, Quant 0.52 (H) 0.00 - 0.50 ug/mL-FEU  CBC with Differential/Platelet   Result Value Ref Range   WBC 7.6 4.0 - 10.5 K/uL   RBC 4.95 4.22 - 5.81 MIL/uL   Hemoglobin 14.7 13.0 - 17.0 g/dL   HCT 04.5 40.9 - 81.1 %   MCV 86.9 78.0 - 100.0 fL   MCH 29.7 26.0 - 34.0 pg   MCHC 34.2 30.0 - 36.0 g/dL   RDW 91.4 78.2 - 95.6 %   Platelets 186 150 - 400 K/uL   Neutrophils Relative % 59 %   Neutro Abs 4.5 1.7 - 7.7 K/uL   Lymphocytes Relative 29 %   Lymphs Abs 2.2 0.7 - 4.0 K/uL   Monocytes Relative 9 %   Monocytes Absolute 0.7 0.1 - 1.0 K/uL   Eosinophils Relative 3 %   Eosinophils Absolute 0.2 0.0 - 0.7 K/uL   Basophils Relative 0 %   Basophils Absolute 0.0 0.0 - 0.1 K/uL  I-stat troponin, ED (not at [facility])  Result Value Ref Range   Troponin i, poc 0.00 0.00 - 0.08 ng/mL   Comment 3           Imaging Review Dg Chest 2 View  09/19/2015  CLINICAL DATA:  Central chest pain intermittently for 4 days EXAM: CHEST  2 VIEW COMPARISON:  None. FINDINGS: The heart size and mediastinal contours are within normal limits. Both lungs are clear. The visualized skeletal structures are unremarkable. IMPRESSION: No active cardiopulmonary disease. Electronically Signed   By: Esperanza Heir M.D.   On: 09/19/2015 18:23   Ct Angio Chest Pe W/cm &/or Wo Cm  09/19/2015  CLINICAL DATA:  Chest pain for 4 days. Hypertension. Abnormal electrocardiogram EXAM: CT ANGIOGRAPHY CHEST WITH CONTRAST TECHNIQUE: Multidetector CT imaging of the chest was performed using the standard protocol during bolus administration of intravenous contrast. Multiplanar CT image reconstructions and MIPs were obtained to evaluate the vascular anatomy. CONTRAST:  OMNIPAQUE IOHEXOL 350 MG/ML SOLN COMPARISON:  Chest radiograph September 19, 2015 FINDINGS: There is no demonstrable pulmonary embolus. There are scattered foci of atherosclerotic calcification in aorta. There is no thoracic aortic aneurysm or dissection. The visualized great vessels appear unremarkable. There is mild bibasilar atelectatic change.  There is no edema or consolidation. Thyroid appears unremarkable. There is no thoracic adenopathy. There are small mediastinal lymph nodes which do not meet size criteria for pathologic significance. There are scattered foci of coronary artery calcification. The pericardium is not thickened. Visualized upper abdominal structures appear unremarkable. There is degenerative change in the thoracic spine. There are no blastic or lytic bone lesions. Review of the MIP images confirms the above findings. IMPRESSION: No demonstrable pulmonary embolus. Mild bibasilar atelectasis. No edema or consolidation. Scattered small mediastinal lymph nodes without adenopathy by size criteria. There are scattered foci of coronary artery calcification. Electronically Signed   By: Bretta Bang III M.D.   On: 09/19/2015 19:54  I have personally reviewed and evaluated these images and lab results as part of my medical decision-making.   EKG Interpretation Rate: 68 Rhythm: Normal sinus Axis: Normal Intervals: Normal QRS: Right bundle branch block ST-T wave: Normal No old tracing available for comparison.      MDM   Final diagnoses:  Chest wall pain    Chest pain which certainly seems musculoskeletal based on history and physical exam. He does have significant cardiac risk factors and also might be at increased risk for pulmonary embolism based on the long time spent in the car in a routine basis. Old records were reviewed and he was sent from his physician's office because of a new right bundle branch block. However, on review of past records, I did see a cardiology consultation in 2009 where right bundle branch block was noted and also a notation that right bundle branch block of been present since 2006. I could find no ECG or mention of ECG without a right bundle branch block. Troponin will be checked as well as d-dimer and will be given a therapeutic trial of ketorolac. However, given his risk factors, it would  certainly be reasonable to set up stress test for routine screening.  Troponin has come back normal but d-dimer is come back borderline elevated. He is sent for CT angiogram which shows no evidence of pulmonary embolism. He had good relief of pain with ketorolac and has not had recurrence of pain in the ED. He is reassured that this current pain appears to be musculoskeletal, but he is referred to [facility] for evaluation for possible outpatient stress testing.  Dione Booze, MD 09/20/15 [place]

## 2015-09-19 NOTE — Discharge Instructions (Signed)
Take ibuprofen or naproxen as needed for pain. Make an appointment with the cardiologist for consideration for stress testing.  Chest Wall Pain Chest wall pain is pain in or around the bones and muscles of your chest. Sometimes, an injury causes this pain. Sometimes, the cause may not be known. This pain may take several weeks or longer to get better. HOME CARE INSTRUCTIONS  Pay attention to any changes in your symptoms. Take these actions to help with your pain:   Rest as told by your health care provider.   Avoid activities that cause pain. These include any activities that use your chest muscles or your abdominal and side muscles to lift heavy items.   If directed, apply ice to the painful area:  Put ice in a plastic bag.  Place a towel between your skin and the bag.  Leave the ice on for 20 minutes, 2-3 times per day.  Take over-the-counter and prescription medicines only as told by your health care provider.  Do not use tobacco products, including cigarettes, chewing tobacco, and e-cigarettes. If you need help quitting, ask your health care provider.  Keep all follow-up visits as told by your health care provider. This is important. SEEK MEDICAL CARE IF:  You have a fever.  Your chest pain becomes worse.  You have new symptoms. SEEK IMMEDIATE MEDICAL CARE IF:  You have nausea or vomiting.  You feel sweaty or light-headed.  You have a cough with phlegm (sputum) or you cough up blood.  You develop shortness of breath.   This information is not intended to replace advice given to you by your health care provider. Make sure you discuss any questions you have with your health care provider.   Document Released: 09/17/2005 Document Revised: 06/08/2015 Document Reviewed: 12/13/2014 Elsevier Interactive Patient Education [employer]2016 [employer]vier Inc.

## 2015-09-19 NOTE — Progress Notes (Signed)
Subjective:    Patient ID: [name] [name], male    DOB: Mar 06, 1971, 44 y.o.   MRN: [id number]  CC: Chest Pain and Shortness of Breath   HPI: [name] [name] is a 44 y.o. male presenting for Chest Pain and Shortness of Breath  Started having chest pain 4 days ago, thought it was heart burn at first, took some tums, maybe helped When he lies down feels like he cant breath unless he has CPAP on, SOB otherwise is no worse than usual No SOB now When he coughs it makes CP worse He can press over his chest L side and worsen the soreness He does have chest pain that feels like he "was tackled" or "punched" at rest that has been coming and going since 4 days ago when it started Sometimes feels like it gets tight or like a pressure in his chest Pain is no worse with exertion No radiation of pain, no jaw pain or L arm pain No nausea  No diabetes Fam hx: father had 2 heart attacks, first at 55yo, is a smoker  Denies ever having similar pain like this before No heart problems before Stress test 8-9 years ago that was normal per pt Feeling not well yesterday in general Appetite has been ok    Depression screen [facility] 2/9 09/19/2015 07/18/2015  Decreased Interest 0 0  Down, Depressed, Hopeless 0 0  PHQ - 2 Score 0 0     Relevant past medical, surgical, family and social history reviewed and updated as indicated. Interim medical history since our last visit reviewed. Allergies and medications reviewed and updated.    ROS: Per HPI unless specifically indicated above  History  Smoking status  . Never Smoker   Smokeless tobacco  . Not on file    Past Medical History Patient Active Problem List   Diagnosis Date Noted  . Obstructive sleep apnea 05/14/2014  . HTN (hypertension) 04/28/2013    Current Outpatient Prescriptions  Medication Sig Dispense Refill  . amLODipine-benazepril (LOTREL) 5-20 MG capsule Take 1 capsule by mouth daily. 90 capsule 0  . atorvastatin (LIPITOR)  40 MG tablet TAKE 1 TABLET (40 MG TOTAL) BY MOUTH DAILY. 90 tablet 1   No current facility-administered medications for this visit.       Objective:    BP 129/83 mmHg  Pulse 78  Temp(Src) 97.1 F (36.2 C) (Oral)  Ht  (1.753 m)  Wt 292 lb 9.6 oz (132.722 kg)  BMI 43.19 kg/m2  Wt Readings from Last 3 Encounters:  09/19/15 292 lb 9.6 oz (132.722 kg)  07/18/15 282 lb 9.6 oz (128.187 kg)  05/02/15 275 lb 6.4 oz (124.921 kg)     Gen: NAD, alert, cooperative with exam, NCAT EYES: EOMI, no scleral injection or icterus ENT:   OP without erythema LYMPH: no cervical LAD CV: NRRR, normal S1/S2, no murmur, distal pulses 2+ b/l Resp: CTABL, no wheezes, normal WOB Abd: +BS, soft, NTND. no guarding or organomegaly Ext: No edema, warm Neuro: Alert and oriented, strength equal b/l UE and LE MSK: tender with palpation over L side of chest wall, parasternal. Pain along ribs with palpation below L breast.  EKG: HR 75, NSR, p waves before every QRS, new RBBB, t-waves upright lateral leads     Assessment & Plan:    [name] [name] was seen today for atypical chest pain and new RBBB on EKG. He has been having chest pain for past 4 days. Per pt he had normal stress  test over 8 years ago. He is having some chest pain now, describes it as a soreness, worse when he presses on his chest.  Given new RBBB, continued soreness in chest, will send to ED for troponin for ACS rule out as not able to obtain in clinic.   Diagnoses and all orders for this visit:  Chest pain, unspecified chest pain type -     EKG 12-Lead   Follow up plan: As needed  Rex Krasarol Vincent, MD [facility] 09/19/2015, 4:15 PM

## 2015-09-19 NOTE — ED Notes (Signed)
[facility] and LT-sided, non-radiating chest pain since Thursday. Pt told he has a possible RT bundle branch block. Pt denies any other systems. AO x4.

## 2015-09-20 ENCOUNTER — Encounter: Payer: Self-pay | Admitting: Pediatrics

## 2015-09-20 DIAGNOSIS — I4510 Unspecified right bundle-branch block: Secondary | ICD-10-CM | POA: Insufficient documentation

## 2015-09-22 ENCOUNTER — Other Ambulatory Visit: Payer: Self-pay

## 2015-09-22 DIAGNOSIS — R079 Chest pain, unspecified: Secondary | ICD-10-CM

## 2015-10-19 ENCOUNTER — Ambulatory Visit (INDEPENDENT_AMBULATORY_CARE_PROVIDER_SITE_OTHER): Payer: BLUE CROSS/BLUE SHIELD | Admitting: Cardiovascular Disease

## 2015-10-19 ENCOUNTER — Encounter: Payer: Self-pay | Admitting: Cardiovascular Disease

## 2015-10-19 VITALS — BP 122/80 | HR 65 | Ht 68.5 in | Wt 287.0 lb

## 2015-10-19 DIAGNOSIS — E785 Hyperlipidemia, unspecified: Secondary | ICD-10-CM

## 2015-10-19 DIAGNOSIS — I10 Essential (primary) hypertension: Secondary | ICD-10-CM | POA: Diagnosis not present

## 2015-10-19 DIAGNOSIS — R079 Chest pain, unspecified: Secondary | ICD-10-CM | POA: Diagnosis not present

## 2015-10-19 NOTE — Patient Instructions (Signed)
Your physician has requested that you have an exercise tolerance test. For further information please visit [email or web address]. Please also follow instruction sheet, as given.  Dr [place] recommends that you follow-up with her as needed. 

## 2015-10-19 NOTE — Progress Notes (Signed)
Cardiology Office Note   Date:  10/19/2015   ID:  [name] [name], DOB 01-Jun-1971, MRN [id number]  PCP:  Nils Pyle, MD  Cardiologist:   Madilyn Hook, MD   Chief Complaint  Patient presents with  . New Evaluation    pt c/o chest tightness, happens randomly, lasts about 5-10 mins.  . Shortness of Breath    on exertion//no other Sx. or concerns      History of Present Illness: [name] [name] is a 45 y.o. male with hypertension and OSA who presents for an evaluation of chest pain.  [name] saw his PCP, Rex Kras, MD, on 12/19 with a complaint of chest pain and shortness of breath.  He was noted to have a RBBB on EKG and was referred to the ED for evaluation.  In the ED troponin was negative, bur d-dimer was mildly elevated.  He went for chest CT-A that was negative for PE.  His chest pain was alleviated with ketorolac and it was felt that his pain was musculoskeletal.  He was instructed to follow up with cardiology as an outpatient.  [name] has noted chest pain in his epigastric region that radiates across his left chest.  If feels like a chest tightness that sometimes feels like heart burn.  It is moderate in severity and lasts for 5-10 minutes each time.  This occurs 3-4 times per week. It occurs both at rest as well as with exertion. It feels better when he stretches or lays back.  There is mild associated shortness of breath but no nausea or diaphoresis. Twice he did feel hot and flushed. This is been ongoing for the last 1-1/2 months. [name] does not get much formal exercise. However he does walk around at work and has to climb poles.This does not seem to make the chest pain any worse.  His father had his first heart attack at age 8.  [name] sometimes notes palpitations when laying in bed at night.  t lasts for seconds at a time and is not associated with lightheadedness or dizziness.It does not occur at the same time as the chest pain.  He drinks  one Diet Coke daily and no other caffeine.  He denies lower extremity edema, orthopnea, or PND.   Past Medical History  Diagnosis Date  . Hypertension   . Hyperlipidemia     Past Surgical History  Procedure Laterality Date  . Knee surgery Left 2003  . Biceps tendon repair       Current Outpatient Prescriptions  Medication Sig Dispense Refill  . amLODipine-benazepril (LOTREL) 5-20 MG capsule Take 1 capsule by mouth daily. 90 capsule 0  . atorvastatin (LIPITOR) 40 MG tablet TAKE 1 TABLET (40 MG TOTAL) BY MOUTH DAILY. 90 tablet 1  . Cyanocobalamin (B-12 PO) Take 1 tablet by mouth daily.     No current facility-administered medications for this visit.    Allergies:   Review of patient's allergies indicates no known allergies.    Social History:  The patient  reports that he has never smoked. He does not have any smokeless tobacco history on file. He reports that he does not drink alcohol or use illicit drugs.   Family History:  The patient's family history includes Cancer in his mother; Heart attack in his father.    ROS:  Please see the history of present illness.   Otherwise, review of systems are positive for none.   All other systems are reviewed and  negative.    PHYSICAL EXAM: VS:  BP 122/80 mmHg  Pulse 65  Ht 5' 8.5" (1.74 m)  Wt 130.182 kg (287 lb)  BMI 43.00 kg/m2 , BMI Body mass index is 43 kg/(m^2). GENERAL:  Well appearing HEENT:  Pupils equal round and reactive, fundi not visualized, oral mucosa unremarkable NECK:  No jugular venous distention, waveform within normal limits, carotid upstroke brisk and symmetric, no bruits, no thyromegaly LYMPHATICS:  No cervical adenopathy LUNGS:  Clear to auscultation bilaterally HEART:  RRR.  PMI not displaced or sustained,S1 and S2 within normal limits, no S3, no S4, no clicks, no rubs, no murmurs.  Distant heart sounds. ABD:  Flat, positive bowel sounds normal in frequency in pitch, no bruits, no rebound, no guarding, no  midline pulsatile mass, no hepatomegaly, no splenomegaly EXT:  2 plus pulses throughout, no edema, no cyanosis no clubbing SKIN:  No rashes no nodules NEURO:  Cranial nerves II through XII grossly intact, motor grossly intact throughout PSYCH:  Cognitively intact, oriented to person place and time   EKG:  EKG is ordered today. The ekg ordered today demonstrates Sinus rhythm. Rate 65 bpm. Right bundle branch block.   Recent Labs: 01/24/2015: ALT 23 09/19/2015: BUN 13; Creatinine, Ser 1.11; Hemoglobin 14.7; Platelets 186; Potassium 4.2; Sodium 138    Lipid Panel    Component Value Date/Time   CHOL 174 01/24/2015 1444   CHOL 183 04/28/2013 0944   TRIG 131 01/24/2015 1444   TRIG 139 04/28/2013 0944   HDL 35* 01/24/2015 1444   HDL 36* 04/28/2013 0944   CHOLHDL 5.1* 04/28/2013 0944   LDLCALC 119* 04/28/2013 0944      Wt Readings from Last 3 Encounters:  10/19/15 130.182 kg (287 lb)  09/19/15 132.722 kg (292 lb 9.6 oz)  07/18/15 128.187 kg (282 lb 9.6 oz)      ASSESSMENT AND PLAN:  # Atypical chest pain: [name] chest pain seems unlikely to be ischemic.  It occurs both at rest and with exertion.  Also, it is worse upon palpation.  I suspect that it is musculoskeletal.  Given that he has a family history of premature coronary artery disease, we will obtain a treadmill stress test to evaluate for ischemia.   # Hypertension: Blood pressure is well-controlled.  Continue amlodipine and benazepril.  # Hyperlipidemia: Continue atorvastatin.   Current medicines are reviewed at length with the patient today.  The patient does not have concerns regarding medicines.  The following changes have been made:  no change  Labs/ tests ordered today include:   Orders Placed This Encounter  Procedures  . Exercise Tolerance Test  . EKG 12-Lead     Disposition:   FU with Brannan Cassedy C. Duke Salvia, MD, [facility] as needed.    This note was written with the assistance of speech recognition  software.  Please excuse any transcriptional errors.  Signed, Magdelena Kinsella C. Duke Salvia, MD, [facility]  10/19/2015 1:53 PM    [facility]

## 2015-11-04 ENCOUNTER — Telehealth (HOSPITAL_COMMUNITY): Payer: Self-pay

## 2015-11-04 NOTE — Telephone Encounter (Signed)
Encounter complete. 

## 2015-11-09 ENCOUNTER — Ambulatory Visit (HOSPITAL_COMMUNITY)
Admission: RE | Admit: 2015-11-09 | Discharge: 2015-11-09 | Disposition: A | Discharge status: Home / Self Care | Payer: BLUE CROSS/BLUE SHIELD | Source: Ambulatory Visit | Attending: Internal Medicine | Admitting: Internal Medicine

## 2015-11-09 ENCOUNTER — Encounter (HOSPITAL_COMMUNITY): Payer: Self-pay | Admitting: *Deleted

## 2015-11-09 DIAGNOSIS — R079 Chest pain, unspecified: Secondary | ICD-10-CM

## 2015-11-09 LAB — EXERCISE TOLERANCE TEST
CHL RATE OF PERCEIVED EXERTION: 17
CSEPEDS: 30 s
CSEPEW: 10.1 METS
CSEPPHR: 151 {beats}/min
Exercise duration (min): 8 min
MPHR: 175 {beats}/min
Percent HR: 86 %
Rest HR: 55 {beats}/min

## 2015-11-09 NOTE — Progress Notes (Unsigned)
Patient ID: [name] [name], male   DOB: Aug 26, 1971, 45 y.o.   MRN: [id number] Pt. Had some upsloping ST changes, Dr. Royann Shivers reviewed and ok'd to be d/c.

## 2015-11-11 ENCOUNTER — Telehealth: Payer: Self-pay | Admitting: *Deleted

## 2015-11-11 NOTE — Telephone Encounter (Signed)
-----   Message from Chilton Si, MD sent at 11/10/2015 10:13 AM EST ----- Low risk stress test.

## 2015-11-11 NOTE — Telephone Encounter (Signed)
Left detailed message of test results on secure voicemail . Can call back if needed.

## 2015-12-24 ENCOUNTER — Other Ambulatory Visit: Payer: Self-pay | Admitting: Family Medicine

## 2016-02-07 ENCOUNTER — Other Ambulatory Visit: Payer: Self-pay | Admitting: Family Medicine

## 2016-03-22 ENCOUNTER — Other Ambulatory Visit: Payer: Self-pay | Admitting: Pediatrics

## 2016-03-22 NOTE — Telephone Encounter (Signed)
Pt needs f/u with Dr Oswaldo DoneVincent it has been 8238m onths since she sent him to Cardiology

## 2016-03-22 NOTE — Telephone Encounter (Signed)
Last seen 09/19/15 Dr Oswaldo DoneVincent  Requesting 90 day supply

## 2016-04-13 ENCOUNTER — Ambulatory Visit (INDEPENDENT_AMBULATORY_CARE_PROVIDER_SITE_OTHER): Payer: BLUE CROSS/BLUE SHIELD | Admitting: Nurse Practitioner

## 2016-04-13 ENCOUNTER — Encounter: Payer: Self-pay | Admitting: Nurse Practitioner

## 2016-04-13 VITALS — BP 113/77 | HR 56 | Temp 98.6°F | Ht 68.0 in | Wt 281.0 lb

## 2016-04-13 DIAGNOSIS — Z024 Encounter for examination for driving license: Secondary | ICD-10-CM

## 2016-04-13 LAB — URINALYSIS
BILIRUBIN UA: NEGATIVE
GLUCOSE, UA: NEGATIVE
Ketones, UA: NEGATIVE
Leukocytes, UA: NEGATIVE
NITRITE UA: NEGATIVE
PH UA: 7 (ref 5.0–7.5)
Protein, UA: NEGATIVE
RBC, UA: NEGATIVE
Specific Gravity, UA: 1.02 (ref 1.005–1.030)
UUROB: 0.2 mg/dL (ref 0.2–1.0)

## 2016-04-13 NOTE — Progress Notes (Signed)
Patient ID: [name] [name], male   DOB: [birth year], 45 y.o.   MRN: [id number]  Patient in today for private DOT- see scanned in document

## 2016-04-13 NOTE — Progress Notes (Signed)
   Subjective:    Patient ID: Erin FullingJeffrey Kurtz, male    DOB: 15-Jul-1971, 45 y.o.   MRN: [id number]  HPI    Review of Systems     Objective:   Physical Exam        Assessment & Plan:

## 2016-04-24 ENCOUNTER — Encounter: Payer: BLUE CROSS/BLUE SHIELD | Admitting: Nurse Practitioner

## 2016-06-23 ENCOUNTER — Other Ambulatory Visit: Payer: Self-pay | Admitting: Pediatrics

## 2016-08-03 ENCOUNTER — Encounter: Payer: Self-pay | Admitting: Nurse Practitioner

## 2016-08-03 ENCOUNTER — Ambulatory Visit (HOSPITAL_COMMUNITY)
Admission: RE | Admit: 2016-08-03 | Discharge: 2016-08-03 | Disposition: A | Discharge status: Home / Self Care | Payer: BLUE CROSS/BLUE SHIELD | Source: Ambulatory Visit | Attending: Nurse Practitioner | Admitting: Nurse Practitioner

## 2016-08-03 ENCOUNTER — Ambulatory Visit (INDEPENDENT_AMBULATORY_CARE_PROVIDER_SITE_OTHER): Payer: BLUE CROSS/BLUE SHIELD | Admitting: Nurse Practitioner

## 2016-08-03 ENCOUNTER — Ambulatory Visit: Payer: BLUE CROSS/BLUE SHIELD | Admitting: Family Medicine

## 2016-08-03 VITALS — BP 111/75 | HR 66 | Temp 97.9°F | Ht 68.0 in | Wt 285.0 lb

## 2016-08-03 DIAGNOSIS — R221 Localized swelling, mass and lump, neck: Secondary | ICD-10-CM

## 2016-08-03 DIAGNOSIS — E079 Disorder of thyroid, unspecified: Secondary | ICD-10-CM | POA: Diagnosis not present

## 2016-08-03 MED ORDER — SULFAMETHOXAZOLE-TRIMETHOPRIM 800-160 MG PO TABS: 1.0000 | ORAL_TABLET | Freq: Two times a day (BID) | ORAL | 0 refills | Status: DC

## 2016-08-03 NOTE — Addendum Note (Signed)
Addended by: Bennie PieriniMARTIN, MARY-MARGARET on: 08/03/2016 05:59 PM   Modules accepted: Orders

## 2016-08-03 NOTE — Progress Notes (Addendum)
   Subjective:    Patient ID: [name] [name], male    DOB: 1970/10/13, 45 y.o.   MRN: [id number]  HPI Patient comes in c/o "Knot" on right anterior side of neck. Noticed it Monday of this week. No change in size- non tender.     Review of Systems  Constitutional: Negative.   HENT: Negative.   Respiratory: Negative.   Cardiovascular: Negative.   Gastrointestinal: Negative.   Genitourinary: Negative.   Neurological: Negative.   Psychiatric/Behavioral: Negative.   All other systems reviewed and are negative.      Objective:   Physical Exam  Constitutional: He appears well-developed. No distress.  Cardiovascular: Normal rate, regular rhythm and normal heart sounds.   Pulmonary/Chest: Effort normal and breath sounds normal.  Skin: Skin is warm.  4cm indurated nontender nodule right anterior neck right next to midline.  Psychiatric: He has a normal mood and affect. His behavior is normal. Judgment and thought content normal.   BP 111/75   Pulse 66   Temp 97.9 F (36.6 C) (Oral)   Ht 5\' 8"  (1.727 m)   Wt 285 lb (129.3 kg)   BMI 43.33 kg/m         Assessment & Plan:  1. Neck nodule Will talk after get U/S back - US Soft Tissue Head/Neck; Future   Mary-Margaret Daphine DeutscherMartin, FNP  * radiology called- area in question is either infection or lymphnode- recommeded +CT for further evaluation. Since cannot schedule CT till MOnday will do antibiotic over weekend and have patient call moinday if not change and then will order CT.

## 2016-08-08 ENCOUNTER — Other Ambulatory Visit: Payer: Self-pay | Admitting: Nurse Practitioner

## 2016-09-18 ENCOUNTER — Other Ambulatory Visit: Payer: Self-pay | Admitting: Family Medicine

## 2016-09-18 DIAGNOSIS — J069 Acute upper respiratory infection, unspecified: Secondary | ICD-10-CM

## 2016-09-24 ENCOUNTER — Other Ambulatory Visit: Payer: Self-pay | Admitting: Nurse Practitioner

## 2017-02-26 ENCOUNTER — Other Ambulatory Visit: Payer: Self-pay | Admitting: Family Medicine

## 2017-02-28 NOTE — Telephone Encounter (Signed)
lipitor deneird- ntbs

## 2017-02-28 NOTE — Telephone Encounter (Signed)
Left detailed message stating to call back to schedule an appt as he would ntbs prior to any refills.

## 2017-04-05 ENCOUNTER — Encounter: Payer: BLUE CROSS/BLUE SHIELD | Admitting: Family Medicine

## 2017-04-05 ENCOUNTER — Ambulatory Visit (INDEPENDENT_AMBULATORY_CARE_PROVIDER_SITE_OTHER): Payer: BLUE CROSS/BLUE SHIELD | Admitting: Family Medicine

## 2017-04-05 VITALS — BP 122/77 | HR 70 | Ht 68.0 in | Wt 291.0 lb

## 2017-04-05 DIAGNOSIS — Z024 Encounter for examination for driving license: Secondary | ICD-10-CM

## 2017-04-05 NOTE — Progress Notes (Signed)
Subjective:  Patient ID: [name] [name], male    DOB: 12-28-1970  Age: 46 y.o. MRN: [id number]  CC: Private DOT Physical   HPI [name] [name] presents for DOT exam. Hx BP. Sleep apnea. Compliance report shows (7% use >4 hours per night.  Depression screen [facility] 2/9 04/05/2017 08/03/2016 09/19/2015  Decreased Interest 0 0 0  Down, Depressed, Hopeless 0 0 0  PHQ - 2 Score 0 0 0    History [name] [name] has a past medical history of Hyperlipidemia and Hypertension.   [name] [name] has a past surgical history that includes Knee surgery (Left, 2003) and Biceps tendon repair.   His family history includes Cancer in his mother; Heart attack in his father.[name] [name] reports that [name] [name] has never smoked. [name] [name] does not have any smokeless tobacco history on file. [name] [name] reports that [name] [name] does not drink alcohol or use drugs.    ROS Review of Systems  Constitutional: Negative for activity change, appetite change, chills, diaphoresis, fatigue, fever and unexpected weight change.  HENT: Negative for congestion, ear pain, hearing loss, postnasal drip, rhinorrhea, sore throat, tinnitus and trouble swallowing.   Eyes: Negative for photophobia, pain, discharge and redness.  Respiratory: Negative for apnea, cough, choking, chest tightness, shortness of breath, wheezing and stridor.   Cardiovascular: Negative for chest pain, palpitations and leg swelling.  Gastrointestinal: Negative for abdominal distention, abdominal pain, blood in stool, constipation, diarrhea, nausea and vomiting.  Endocrine: Negative for cold intolerance, heat intolerance, polydipsia, polyphagia and polyuria.  Genitourinary: Negative for difficulty urinating, dysuria, enuresis, flank pain, frequency, genital sores, hematuria and urgency.  Musculoskeletal: Negative for arthralgias and joint swelling.  Skin: Negative for color change, rash and wound.  Allergic/Immunologic: Negative for immunocompromised state.  Neurological: Negative for dizziness, tremors, seizures,  syncope, facial asymmetry, speech difficulty, weakness, light-headedness, numbness and headaches.  Hematological: Does not bruise/bleed easily.  Psychiatric/Behavioral: Negative for agitation, behavioral problems, confusion, decreased concentration, dysphoric mood, hallucinations, sleep disturbance and suicidal ideas. The patient is not nervous/anxious and is not hyperactive.     Objective:  BP 122/77   Pulse 70   Ht 5\' 8"  (1.727 m)   Wt 291 lb (132 kg)   BMI 44.25 kg/m   BP Readings from Last 3 Encounters:  04/05/17 122/77  08/03/16 111/75  04/13/16 113/77    Wt Readings from Last 3 Encounters:  04/05/17 291 lb (132 kg)  08/03/16 285 lb (129.3 kg)  04/13/16 281 lb (127.5 kg)     Physical Exam  Constitutional: [name] [name] is oriented to person, place, and time. [name] [name] appears well-developed and well-nourished.  HENT:  Head: Normocephalic and atraumatic.  Mouth/Throat: Oropharynx is clear and moist.  Eyes: EOM are normal. Pupils are equal, round, and reactive to light.  Neck: Normal range of motion. No tracheal deviation present. No thyromegaly present.  Cardiovascular: Normal rate, regular rhythm and normal heart sounds.  Exam reveals no gallop and no friction rub.   No murmur heard. Pulmonary/Chest: Breath sounds normal. [name] [name] has no wheezes. [name] [name] has no rales.  Abdominal: Soft. [name] [name] exhibits no mass. There is no tenderness.  Musculoskeletal: Normal range of motion. [name] [name] exhibits no edema.  Neurological: [name] [name] is alert and oriented to person, place, and time.  Skin: Skin is warm and dry.  Psychiatric: [name] [name] has a normal mood and affect.      Assessment & Plan:   Isom was seen today for private dot physical.  Diagnoses and all orders for this visit:  Encounter for Department of Transportation (DOT) examination for  driving license renewal       I have discontinued Mr. Henriette CombsJohnson's sulfamethoxazole-trimethoprim and NASONEX. I am also having him maintain his atorvastatin and  amLODipine-benazepril.  Allergies as of 04/05/2017   No Known Allergies     Medication List       Accurate as of 04/05/17  3:06 PM. Always use your most recent med list.          amLODipine-benazepril 5-20 MG capsule Commonly known as:  LOTREL TAKE 1 CAPSULE BY MOUTH DAILY.   atorvastatin 40 MG tablet Commonly known as:  LIPITOR TAKE 1 TABLET (40 MG TOTAL) BY MOUTH DAILY.        Follow-up: Return in about 1 year (around 04/05/2018).  Mechele ClaudeWarren Maelin Kurkowski, M.D.

## 2017-05-30 ENCOUNTER — Telehealth: Payer: Self-pay

## 2017-05-30 NOTE — Telephone Encounter (Signed)
Write for CPAP Supplies on reg RX  With DX   Call patient when ready

## 2017-05-31 NOTE — Telephone Encounter (Signed)
Rx on provider's desk for signature 

## 2017-06-06 NOTE — Telephone Encounter (Signed)
Pt aware Rx at front desk ready for pick up

## 2017-06-12 ENCOUNTER — Other Ambulatory Visit: Payer: Self-pay | Admitting: *Deleted

## 2017-06-12 MED ORDER — AMLODIPINE BESY-BENAZEPRIL HCL 5-20 MG PO CAPS: 1.0000 | ORAL_CAPSULE | Freq: Every day | ORAL | 0 refills | Status: DC

## 2017-06-14 DIAGNOSIS — G4733 Obstructive sleep apnea (adult) (pediatric): Secondary | ICD-10-CM | POA: Diagnosis not present

## 2017-08-20 ENCOUNTER — Ambulatory Visit: Payer: BLUE CROSS/BLUE SHIELD | Admitting: Physician Assistant

## 2017-08-20 ENCOUNTER — Encounter: Payer: Self-pay | Admitting: Physician Assistant

## 2017-08-20 VITALS — BP 111/71 | HR 69 | Temp 97.2°F | Ht 68.0 in | Wt 288.0 lb

## 2017-08-20 DIAGNOSIS — Z0000 Encounter for general adult medical examination without abnormal findings: Secondary | ICD-10-CM

## 2017-08-20 DIAGNOSIS — Z6841 Body Mass Index (BMI) 40.0 and over, adult: Secondary | ICD-10-CM | POA: Diagnosis not present

## 2017-08-20 DIAGNOSIS — I10 Essential (primary) hypertension: Secondary | ICD-10-CM

## 2017-08-20 DIAGNOSIS — G4733 Obstructive sleep apnea (adult) (pediatric): Secondary | ICD-10-CM

## 2017-08-20 DIAGNOSIS — E785 Hyperlipidemia, unspecified: Secondary | ICD-10-CM

## 2017-08-20 MED ORDER — AMLODIPINE BESY-BENAZEPRIL HCL 5-20 MG PO CAPS: 1.0000 | ORAL_CAPSULE | Freq: Every day | ORAL | 0 refills | Status: DC

## 2017-08-20 MED ORDER — ATORVASTATIN CALCIUM 40 MG PO TABS: ORAL_TABLET | ORAL | 1 refills | Status: DC

## 2017-08-20 NOTE — Patient Instructions (Signed)
In a few days you may receive a survey in the mail or online from [employer] regarding your visit with us today. Please take a moment to fill this out. Your feedback is very important to our whole office. It can help us better understand your needs as well as improve your experience and satisfaction. Thank you for taking your time to complete it. We care about you.  Jennier Schissler, PA-C  

## 2017-08-20 NOTE — Progress Notes (Signed)
BP 111/71   Pulse 69   Temp (!) 97.2 F (36.2 C) (Oral)   Ht '5\' 8"'  (1.727 m)   Wt 288 lb (130.6 kg)   BMI 43.79 kg/m    Subjective:    Patient ID: [name] [name], male    DOB: Nov 29, 1970, 46 y.o.   MRN: [id number]  HPI: [name] [name] is a 46 y.o. male presenting on 08/20/2017 for Follow-up and Hypertension  Patient comes in for recheck on hypertension, hyperlipidemia.  He also has sleep apnea.  He states that he is using his machinery regularly.  It is doing a great service for him and letting him feel better and less fatigue.  He does need refills on medications and labs performed today.  Denies any problems with chest pain or shortness of breath.  No nausea vomiting or diarrhea.  Relevant past medical, surgical, family and social history reviewed and updated as indicated. Allergies and medications reviewed and updated.  Past Medical History:  Diagnosis Date  . Hyperlipidemia   . Hypertension     Past Surgical History:  Procedure Laterality Date  . BICEPS TENDON REPAIR    . KNEE SURGERY Left 2003    Review of Systems  Constitutional: Negative.  Negative for appetite change and fatigue.  HENT: Negative.   Eyes: Negative.  Negative for pain and visual disturbance.  Respiratory: Negative.  Negative for cough, chest tightness, shortness of breath and wheezing.   Cardiovascular: Negative.  Negative for chest pain, palpitations and leg swelling.  Gastrointestinal: Negative.  Negative for abdominal pain, diarrhea, nausea and vomiting.  Endocrine: Negative.   Genitourinary: Negative.   Musculoskeletal: Negative.   Skin: Negative.  Negative for color change and rash.  Neurological: Negative.  Negative for weakness, numbness and headaches.  Psychiatric/Behavioral: Negative.     Allergies as of 08/20/2017   No Known Allergies     Medication List        Accurate as of 08/20/17 10:09 AM. Always use your most recent med list.          amLODipine-benazepril 5-20 MG  capsule Commonly known as:  LOTREL Take 1 capsule daily by mouth.   atorvastatin 40 MG tablet Commonly known as:  LIPITOR TAKE 1 TABLET (40 MG TOTAL) BY MOUTH DAILY.          Objective:    BP 111/71   Pulse 69   Temp (!) 97.2 F (36.2 C) (Oral)   Ht '5\' 8"'  (1.727 m)   Wt 288 lb (130.6 kg)   BMI 43.79 kg/m   No Known Allergies  Physical Exam  Results for orders placed or performed in visit on 04/13/16  Urinalysis  Result Value Ref Range   Specific Gravity, UA 1.020 1.005 - 1.030   pH, UA 7.0 5.0 - 7.5   Color, UA Yellow Yellow   Appearance Ur Clear Clear   Leukocytes, UA Negative Negative   Protein, UA Negative Negative/Trace   Glucose, UA Negative Negative   Ketones, UA Negative Negative   RBC, UA Negative Negative   Bilirubin, UA Negative Negative   Urobilinogen, Ur 0.2 0.2 - 1.0 mg/dL   Nitrite, UA Negative Negative      Assessment & Plan:   1. Essential hypertension - amLODipine-benazepril (LOTREL) 5-20 MG capsule; Take 1 capsule daily by mouth.  Dispense: 30 capsule; Refill: 0  2. Well adult exam - CBC with Differential/Platelet - CMP14+EGFR - Lipid panel - TSH  3. Hyperlipidemia, unspecified hyperlipidemia type - atorvastatin (LIPITOR) 40  MG tablet; TAKE 1 TABLET (40 MG TOTAL) BY MOUTH DAILY.  Dispense: 90 tablet; Refill: 1  4. Obstructive sleep apnea    Current Outpatient Medications:  .  amLODipine-benazepril (LOTREL) 5-20 MG capsule, Take 1 capsule daily by mouth., Disp: 30 capsule, Rfl: 0 .  atorvastatin (LIPITOR) 40 MG tablet, TAKE 1 TABLET (40 MG TOTAL) BY MOUTH DAILY., Disp: 90 tablet, Rfl: 1 Continue all other maintenance medications as listed above.  Follow up plan: Return in about 6 months (around 02/17/2018) for recheck.  Educational handout given for [place] PA-C [place] [address]  [place], [place] [address] [phone]   08/20/2017, 10:09 AM

## 2017-08-21 LAB — CBC WITH DIFFERENTIAL/PLATELET
BASOS ABS: 0 10*3/uL (ref 0.0–0.2)
Basos: 0 %
EOS (ABSOLUTE): 0.1 10*3/uL (ref 0.0–0.4)
Eos: 2 %
HEMOGLOBIN: 14.1 g/dL (ref 13.0–17.7)
Hematocrit: 41.8 % (ref 37.5–51.0)
Immature Grans (Abs): 0.1 10*3/uL (ref 0.0–0.1)
Immature Granulocytes: 1 %
Lymphocytes Absolute: 3 10*3/uL (ref 0.7–3.1)
Lymphs: 40 %
MCH: 29 pg (ref 26.6–33.0)
MCHC: 33.7 g/dL (ref 31.5–35.7)
MCV: 86 fL (ref 79–97)
MONOCYTES: 8 %
Monocytes Absolute: 0.6 10*3/uL (ref 0.1–0.9)
NEUTROS ABS: 3.8 10*3/uL (ref 1.4–7.0)
Neutrophils: 49 %
PLATELETS: 218 10*3/uL (ref 150–379)
RBC: 4.87 x10E6/uL (ref 4.14–5.80)
RDW: 14.9 % (ref 12.3–15.4)
WBC: 7.6 10*3/uL (ref 3.4–10.8)

## 2017-08-21 LAB — CMP14+EGFR
ALK PHOS: 64 IU/L (ref 39–117)
ALT: 26 IU/L (ref 0–44)
AST: 17 IU/L (ref 0–40)
Albumin/Globulin Ratio: 1.4 (ref 1.2–2.2)
Albumin: 4.1 g/dL (ref 3.5–5.5)
BILIRUBIN TOTAL: 0.2 mg/dL (ref 0.0–1.2)
BUN / CREAT RATIO: 14 (ref 9–20)
BUN: 15 mg/dL (ref 6–24)
CHLORIDE: 106 mmol/L (ref 96–106)
CO2: 19 mmol/L — AB (ref 20–29)
Calcium: 9.7 mg/dL (ref 8.7–10.2)
Creatinine, Ser: 1.08 mg/dL (ref 0.76–1.27)
GFR calc non Af Amer: 81 mL/min/{1.73_m2} (ref >59)
GFR, EST AFRICAN AMERICAN: 94 mL/min/{1.73_m2} (ref >59)
GLUCOSE: 92 mg/dL (ref 65–99)
Globulin, Total: 2.9 g/dL (ref 1.5–4.5)
Potassium: 4.4 mmol/L (ref 3.5–5.2)
Sodium: 140 mmol/L (ref 134–144)
Total Protein: 7 g/dL (ref 6.0–8.5)

## 2017-08-21 LAB — LIPID PANEL
CHOL/HDL RATIO: 4.2 ratio (ref 0.0–5.0)
Cholesterol, Total: 144 mg/dL (ref 100–199)
HDL: 34 mg/dL — ABNORMAL LOW (ref >39)
LDL CALC: 73 mg/dL (ref 0–99)
Triglycerides: 185 mg/dL — ABNORMAL HIGH (ref 0–149)
VLDL CHOLESTEROL CAL: 37 mg/dL (ref 5–40)

## 2017-08-21 LAB — TSH: TSH: 12.96 u[IU]/mL — AB (ref 0.450–4.500)

## 2017-08-26 ENCOUNTER — Other Ambulatory Visit: Payer: Self-pay | Admitting: *Deleted

## 2017-08-26 MED ORDER — LEVOTHYROXINE SODIUM 50 MCG PO TABS: 50.0000 ug | ORAL_TABLET | Freq: Every day | ORAL | 0 refills | Status: DC

## 2017-09-13 ENCOUNTER — Other Ambulatory Visit: Payer: Self-pay | Admitting: *Deleted

## 2017-09-13 DIAGNOSIS — I10 Essential (primary) hypertension: Secondary | ICD-10-CM

## 2017-09-13 MED ORDER — AMLODIPINE BESY-BENAZEPRIL HCL 5-20 MG PO CAPS: 1.0000 | ORAL_CAPSULE | Freq: Every day | ORAL | 0 refills | Status: DC

## 2017-09-28 ENCOUNTER — Other Ambulatory Visit: Payer: Self-pay | Admitting: *Deleted

## 2017-09-28 DIAGNOSIS — I10 Essential (primary) hypertension: Secondary | ICD-10-CM

## 2017-09-28 MED ORDER — AMLODIPINE BESY-BENAZEPRIL HCL 5-20 MG PO CAPS: 1.0000 | ORAL_CAPSULE | Freq: Every day | ORAL | 1 refills | Status: DC

## 2017-11-21 ENCOUNTER — Other Ambulatory Visit: Payer: Self-pay | Admitting: Physician Assistant

## 2017-11-27 ENCOUNTER — Ambulatory Visit: Payer: BLUE CROSS/BLUE SHIELD | Admitting: Physician Assistant

## 2018-02-18 ENCOUNTER — Ambulatory Visit: Payer: BLUE CROSS/BLUE SHIELD | Admitting: Physician Assistant

## 2018-02-18 ENCOUNTER — Encounter: Payer: Self-pay | Admitting: Physician Assistant

## 2018-02-18 ENCOUNTER — Other Ambulatory Visit: Payer: Self-pay | Admitting: Physician Assistant

## 2018-02-18 VITALS — BP 119/76 | HR 54 | Ht 68.0 in | Wt 296.0 lb

## 2018-02-18 DIAGNOSIS — E039 Hypothyroidism, unspecified: Secondary | ICD-10-CM

## 2018-02-18 DIAGNOSIS — G4733 Obstructive sleep apnea (adult) (pediatric): Secondary | ICD-10-CM | POA: Diagnosis not present

## 2018-02-18 DIAGNOSIS — E785 Hyperlipidemia, unspecified: Secondary | ICD-10-CM | POA: Diagnosis not present

## 2018-02-18 DIAGNOSIS — I10 Essential (primary) hypertension: Secondary | ICD-10-CM | POA: Diagnosis not present

## 2018-02-18 MED ORDER — ATORVASTATIN CALCIUM 40 MG PO TABS: ORAL_TABLET | ORAL | 1 refills | Status: DC

## 2018-02-18 MED ORDER — AMLODIPINE BESY-BENAZEPRIL HCL 5-20 MG PO CAPS: 1.0000 | ORAL_CAPSULE | Freq: Every day | ORAL | 1 refills | Status: DC

## 2018-02-18 MED ORDER — LEVOTHYROXINE SODIUM 50 MCG PO TABS: 50.0000 ug | ORAL_TABLET | Freq: Every day | ORAL | 3 refills | Status: DC

## 2018-02-18 NOTE — Telephone Encounter (Signed)
Last lipid  08/20/17 

## 2018-02-19 LAB — TSH: TSH: 6.88 u[IU]/mL — AB (ref 0.450–4.500)

## 2018-02-19 NOTE — Progress Notes (Signed)
BP 119/76   Pulse (!) 54   Ht  (1.727 m)   Wt 296 lb (134.3 kg)   BMI 45.01 kg/m     Subjective:    Patient ID: [name] [name], male    DOB: November 04, 1970, 47 y.o.   MRN: [id number]  HPI: [name] [name] is a 47 y.o. male presenting on 02/18/2018 for Hypertension; Hypothyroidism; and Hyperlipidemia This patient comes in for a 34-month recheck on his medical conditions.  He does have hypothyroidism, hypertension, hyperlipidemia.  He does have sleep apnea.  He does use his CPAP on a very regular basis.  He has no difficulty with using it.  His thyroid had gotten slightly off at his last visit and we started medication.  He has no other complaints at this time.     Past Medical History:  Diagnosis Date  . Hyperlipidemia   . Hypertension    Relevant past medical, surgical, family and social history reviewed and updated as indicated. Interim medical history since our last visit reviewed. Allergies and medications reviewed and updated. DATA REVIEWED: CHART IN EPIC  Family History reviewed for pertinent findings.  Review of Systems  Constitutional: Negative.  Negative for appetite change and fatigue.  Eyes: Negative for pain and visual disturbance.  Respiratory: Negative.  Negative for cough, chest tightness, shortness of breath and wheezing.   Cardiovascular: Negative.  Negative for chest pain, palpitations and leg swelling.  Gastrointestinal: Negative.  Negative for abdominal pain, diarrhea, nausea and vomiting.  Genitourinary: Negative.   Skin: Negative.  Negative for color change and rash.  Neurological: Negative.  Negative for weakness, numbness and headaches.  Psychiatric/Behavioral: Negative.     Allergies as of 02/18/2018   No Known Allergies     Medication List        Accurate as of 02/18/18 11:59 PM. Always use your most recent med list.          amLODipine-benazepril 5-20 MG capsule Commonly known as:  LOTREL Take 1 capsule by mouth daily.   atorvastatin 40  MG tablet Commonly known as:  LIPITOR TAKE 1 TABLET BY MOUTH EVERY DAY   atorvastatin 40 MG tablet Commonly known as:  LIPITOR TAKE 1 TABLET (40 MG TOTAL) BY MOUTH DAILY.   levothyroxine 50 MCG tablet Commonly known as:  SYNTHROID, LEVOTHROID Take 1 tablet (50 mcg total) by mouth daily.          Objective:    BP 119/76   Pulse (!) 54   Ht  (1.727 m)   Wt 296 lb (134.3 kg)   BMI 45.01 kg/m    No Known Allergies  Wt Readings from Last 3 Encounters:  02/18/18 296 lb (134.3 kg)  08/20/17 288 lb (130.6 kg)  04/05/17 291 lb (132 kg)    Physical Exam  Constitutional: He appears well-developed and well-nourished.  HENT:  Head: Normocephalic and atraumatic.  Eyes: Pupils are equal, round, and reactive to light. Conjunctivae and EOM are normal.  Neck: Normal range of motion. Neck supple.  Cardiovascular: Normal rate, regular rhythm and normal heart sounds.  Pulmonary/Chest: Effort normal and breath sounds normal.  Abdominal: Soft. Bowel sounds are normal.  Musculoskeletal: Normal range of motion.  Skin: Skin is warm and dry.    Results for orders placed or performed in visit on 02/18/18  TSH  Result Value Ref Range   TSH 6.880 (H) 0.450 - 4.500 uIU/mL      Assessment & Plan:   1. Essential hypertension - amLODipine-benazepril (  LOTREL) 5-20 MG capsule; Take 1 capsule by mouth daily.  Dispense: 90 capsule; Refill: 1  2. Obstructive sleep apnea Continue CPAP  3. Hyperlipidemia, unspecified hyperlipidemia type - atorvastatin (LIPITOR) 40 MG tablet; TAKE 1 TABLET (40 MG TOTAL) BY MOUTH DAILY.  Dispense: 90 tablet; Refill: 1  4. Hypothyroidism, unspecified type - TSH - levothyroxine (SYNTHROID, LEVOTHROID) 50 MCG tablet; Take 1 tablet (50 mcg total) by mouth daily.  Dispense: 90 tablet; Refill: 3   Continue all other maintenance medications as listed above.  Follow up plan: Return in about 6 months (around 08/21/2018).  Educational handout given for  survey  Remus Loffler PA-C [facility] [address]  [place], [place] [address] [phone]   02/19/2018, 10:16 AM

## 2018-03-12 ENCOUNTER — Encounter: Payer: Self-pay | Admitting: Family Medicine

## 2018-04-01 ENCOUNTER — Encounter: Payer: Self-pay | Admitting: Nurse Practitioner

## 2018-04-01 ENCOUNTER — Ambulatory Visit: Payer: Self-pay | Admitting: Nurse Practitioner

## 2018-04-01 DIAGNOSIS — Z024 Encounter for examination for driving license: Secondary | ICD-10-CM

## 2018-04-01 NOTE — Progress Notes (Signed)
Patient ID: [name] FullingJeffrey [name], male   DOB: Jul 15, 1971, 47 y.o.   MRN: [id number]  Private DOT- see scanned in document

## 2018-07-28 ENCOUNTER — Encounter: Payer: Self-pay | Admitting: Physician Assistant

## 2018-07-28 ENCOUNTER — Ambulatory Visit (INDEPENDENT_AMBULATORY_CARE_PROVIDER_SITE_OTHER): Payer: BLUE CROSS/BLUE SHIELD | Admitting: Physician Assistant

## 2018-07-28 VITALS — BP 133/84 | HR 66 | Temp 98.1°F | Ht 68.0 in | Wt 296.4 lb

## 2018-07-28 DIAGNOSIS — J209 Acute bronchitis, unspecified: Secondary | ICD-10-CM | POA: Diagnosis not present

## 2018-07-28 MED ORDER — FLUCONAZOLE 100 MG PO TABS: 100.0000 mg | ORAL_TABLET | Freq: Every day | ORAL | 2 refills | Status: DC

## 2018-07-28 MED ORDER — PREDNISONE 10 MG (21) PO TBPK
ORAL_TABLET | ORAL | 0 refills | Status: DC
Start: 2018-07-28 — End: 2018-12-14

## 2018-07-28 MED ORDER — AZITHROMYCIN 250 MG PO TABS: ORAL_TABLET | ORAL | 0 refills | Status: DC

## 2018-07-29 NOTE — Progress Notes (Signed)
BP 133/84   Pulse 66   Temp 98.1 F (36.7 C) (Oral)   Ht 5\' 8"  (1.727 m)   Wt 296 lb 6.4 oz (134.4 kg)   BMI 45.07 kg/m    Subjective:    Patient ID: [name] [name], male    DOB: Dec 10, 1970, 47 y.o.   MRN: [id number]  HPI: [name] [name] is a 47 y.o. male presenting on 07/28/2018 for Generalized Body Aches; Headache; and Fatigue  This patient has had less than 2 days severe fever, chills, myalgias.  Complains of sinus headache and postnasal drainage. There is copious drainage at times. Associated sore throat, decreased appetite and headache.  Has been exposed to colds and bronchitis    Past Medical History:  Diagnosis Date  . Hyperlipidemia   . Hypertension    Relevant past medical, surgical, family and social history reviewed and updated as indicated. Interim medical history since our last visit reviewed. Allergies and medications reviewed and updated. DATA REVIEWED: CHART IN EPIC  Family History reviewed for pertinent findings.  Review of Systems  Constitutional: Positive for fatigue. Negative for appetite change.  HENT: Positive for sinus pressure and sore throat.   Eyes: Negative.  Negative for pain and visual disturbance.  Respiratory: Positive for shortness of breath and wheezing. Negative for cough and chest tightness.   Cardiovascular: Negative.  Negative for chest pain, palpitations and leg swelling.  Gastrointestinal: Negative.  Negative for abdominal pain, diarrhea, nausea and vomiting.  Endocrine: Negative.   Genitourinary: Negative.   Musculoskeletal: Positive for back pain and myalgias.  Skin: Negative.  Negative for color change and rash.  Neurological: Positive for headaches. Negative for weakness and numbness.  Psychiatric/Behavioral: Negative.     Allergies as of 07/28/2018   No Known Allergies     Medication List        Accurate as of 07/28/18 11:59 PM. Always use your most recent med list.          amLODipine-benazepril 5-20 MG  capsule Commonly known as:  LOTREL Take 1 capsule by mouth daily.   atorvastatin 40 MG tablet Commonly known as:  LIPITOR TAKE 1 TABLET (40 MG TOTAL) BY MOUTH DAILY.   azithromycin 250 MG tablet Commonly known as:  ZITHROMAX Take as directed   levothyroxine 50 MCG tablet Commonly known as:  SYNTHROID, LEVOTHROID Take 1 tablet (50 mcg total) by mouth daily.   predniSONE 10 MG (21) Tbpk tablet Commonly known as:  STERAPRED UNI-PAK 21 TAB As directed x 6 days          Objective:    BP 133/84   Pulse 66   Temp 98.1 F (36.7 C) (Oral)   Ht 5\' 8"  (1.727 m)   Wt 296 lb 6.4 oz (134.4 kg)   BMI 45.07 kg/m   No Known Allergies  Wt Readings from Last 3 Encounters:  07/28/18 296 lb 6.4 oz (134.4 kg)  02/18/18 296 lb (134.3 kg)  08/20/17 288 lb (130.6 kg)    Physical Exam  Constitutional: He appears well-developed and well-nourished.  HENT:  Head: Normocephalic and atraumatic.  Right Ear: Hearing and tympanic membrane normal.  Left Ear: Hearing and tympanic membrane normal.  Nose: Mucosal edema and sinus tenderness present. No nasal deformity. Right sinus exhibits frontal sinus tenderness. Left sinus exhibits frontal sinus tenderness.  Mouth/Throat: Posterior oropharyngeal erythema present.  Eyes: Pupils are equal, round, and reactive to light. Conjunctivae and EOM are normal. Right eye exhibits no discharge. Left eye exhibits no discharge.  Neck: Normal range of motion. Neck supple.  Cardiovascular: Normal rate, regular rhythm and normal heart sounds.  Pulmonary/Chest: Effort normal. No respiratory distress. He has no decreased breath sounds. He has wheezes. He has no rhonchi. He has no rales.  Abdominal: Soft. Bowel sounds are normal.  Musculoskeletal: Normal range of motion.  Skin: Skin is warm and dry.    Results for orders placed or performed in visit on 02/18/18  TSH  Result Value Ref Range   TSH 6.880 (H) 0.450 - 4.500 uIU/mL      Assessment & Plan:   1.  Bronchitis, acute, with bronchospasm - predniSONE (STERAPRED UNI-PAK 21 TAB) 10 MG (21) TBPK tablet; As directed x 6 days  Dispense: 21 tablet; Refill: 0 - azithromycin (ZITHROMAX Z-PAK) 250 MG tablet; Take as directed  Dispense: 6 each; Refill: 0   Continue all other maintenance medications as listed above.  Follow up plan: No follow-ups on file.  Educational handout given for survey  Remus Loffler PA-C [facility] [address]  [place], [place] [address] [phone]   07/29/2018, 5:26 PM

## 2018-08-22 ENCOUNTER — Ambulatory Visit: Payer: BLUE CROSS/BLUE SHIELD | Admitting: Physician Assistant

## 2018-09-02 ENCOUNTER — Encounter: Payer: Self-pay | Admitting: Physician Assistant

## 2018-12-12 ENCOUNTER — Ambulatory Visit (INDEPENDENT_AMBULATORY_CARE_PROVIDER_SITE_OTHER): Payer: BLUE CROSS/BLUE SHIELD | Admitting: Physician Assistant

## 2018-12-12 ENCOUNTER — Encounter: Payer: Self-pay | Admitting: Physician Assistant

## 2018-12-12 ENCOUNTER — Other Ambulatory Visit: Payer: Self-pay

## 2018-12-12 VITALS — BP 127/83 | HR 103 | Temp 98.7°F | Ht 68.0 in | Wt 297.2 lb

## 2018-12-12 DIAGNOSIS — E785 Hyperlipidemia, unspecified: Secondary | ICD-10-CM | POA: Diagnosis not present

## 2018-12-12 DIAGNOSIS — E039 Hypothyroidism, unspecified: Secondary | ICD-10-CM | POA: Diagnosis not present

## 2018-12-12 DIAGNOSIS — I10 Essential (primary) hypertension: Secondary | ICD-10-CM

## 2018-12-14 NOTE — Progress Notes (Signed)
BP 127/83   Pulse (!) 103   Temp 98.7 F (37.1 C) (Oral)   Ht 5' 8" (1.727 m)   Wt 297 lb 3.2 oz (134.8 kg)   BMI 45.19 kg/m    Subjective:    Patient ID: [name] [name], male    DOB: 24-May-1971, 48 y.o.   MRN: [id number]  HPI: [name] [name] is a 48 y.o. male presenting on 12/12/2018 for Hypertension  This patient comes in for periodic recheck on medications and conditions including hypertension, hypothyroidism, hyperlipidemia.  He is not having any issues at this time.  He does need refills on his medication he also needs a refill on his labs..   All medications are reviewed today. There are no reports of any problems with the medications. All of the medical conditions are reviewed and updated.  Lab work is reviewed and will be ordered as medically necessary. There are no new problems reported with today's visit.   Past Medical History:  Diagnosis Date  . Hyperlipidemia   . Hypertension    Relevant past medical, surgical, family and social history reviewed and updated as indicated. Interim medical history since our last visit reviewed. Allergies and medications reviewed and updated. DATA REVIEWED: CHART IN EPIC  Family History reviewed for pertinent findings.  Review of Systems  Constitutional: Negative.  Negative for appetite change and fatigue.  HENT: Negative.   Eyes: Negative.  Negative for pain and visual disturbance.  Respiratory: Negative.  Negative for cough, chest tightness, shortness of breath and wheezing.   Cardiovascular: Negative.  Negative for chest pain, palpitations and leg swelling.  Gastrointestinal: Negative.  Negative for abdominal pain, diarrhea, nausea and vomiting.  Endocrine: Negative.   Genitourinary: Negative.   Musculoskeletal: Negative.   Skin: Negative.  Negative for color change and rash.  Neurological: Negative.  Negative for weakness, numbness and headaches.  Psychiatric/Behavioral: Negative.     Allergies as of 12/12/2018   No  Known Allergies     Medication List       Accurate as of December 12, 2018 11:59 PM. Always use your most recent med list.        amLODipine-benazepril 5-20 MG capsule Commonly known as:  LOTREL Take 1 capsule by mouth daily.   atorvastatin 40 MG tablet Commonly known as:  LIPITOR TAKE 1 TABLET (40 MG TOTAL) BY MOUTH DAILY.   levothyroxine 50 MCG tablet Commonly known as:  SYNTHROID, LEVOTHROID Take 1 tablet (50 mcg total) by mouth daily.          Objective:    BP 127/83   Pulse (!) 103   Temp 98.7 F (37.1 C) (Oral)   Ht 5' 8" (1.727 m)   Wt 297 lb 3.2 oz (134.8 kg)   BMI 45.19 kg/m   No Known Allergies  Wt Readings from Last 3 Encounters:  12/12/18 297 lb 3.2 oz (134.8 kg)  07/28/18 296 lb 6.4 oz (134.4 kg)  02/18/18 296 lb (134.3 kg)    Physical Exam Vitals signs and nursing note reviewed.  Constitutional:      General: He is not in acute distress.    Appearance: He is well-developed.  HENT:     Head: Normocephalic and atraumatic.  Eyes:     Conjunctiva/sclera: Conjunctivae normal.     Pupils: Pupils are equal, round, and reactive to light.  Cardiovascular:     Rate and Rhythm: Normal rate and regular rhythm.     Heart sounds: Normal heart sounds.  Pulmonary:  Effort: Pulmonary effort is normal. No respiratory distress.     Breath sounds: Normal breath sounds.  Skin:    General: Skin is warm and dry.  Psychiatric:        Behavior: Behavior normal.     Results for orders placed or performed in visit on 02/18/18  TSH  Result Value Ref Range   TSH 6.880 (H) 0.450 - 4.500 uIU/mL      Assessment & Plan:   1. Essential hypertension - CBC with Differential/Platelet; Future - CMP14+EGFR; Future  2. Hypothyroidism, unspecified type - TSH; Future  3. Hyperlipidemia, unspecified hyperlipidemia type - CMP14+EGFR; Future - Lipid panel; Future   Continue all other maintenance medications as listed above.  Follow up plan: Return in about 6  months (around 06/14/2019) for recheck.  Educational handout given for [place] PA-C [facility] [address]  [place], [place] [address] [phone]   12/14/2018, 11:55 PM

## 2019-02-04 ENCOUNTER — Telehealth: Payer: Self-pay | Admitting: Physician Assistant

## 2019-02-10 ENCOUNTER — Telehealth: Payer: Self-pay | Admitting: Physician Assistant

## 2019-02-10 ENCOUNTER — Encounter (INDEPENDENT_AMBULATORY_CARE_PROVIDER_SITE_OTHER): Payer: Self-pay

## 2019-03-16 ENCOUNTER — Other Ambulatory Visit: Payer: Self-pay | Admitting: Physician Assistant

## 2019-03-16 DIAGNOSIS — I10 Essential (primary) hypertension: Secondary | ICD-10-CM

## 2019-04-01 ENCOUNTER — Other Ambulatory Visit: Payer: Self-pay

## 2019-04-02 ENCOUNTER — Encounter: Payer: Self-pay | Admitting: Nurse Practitioner

## 2019-04-02 ENCOUNTER — Ambulatory Visit (INDEPENDENT_AMBULATORY_CARE_PROVIDER_SITE_OTHER): Payer: Self-pay | Admitting: Nurse Practitioner

## 2019-04-02 VITALS — BP 108/72 | HR 66 | Ht 68.0 in | Wt 293.0 lb

## 2019-04-02 DIAGNOSIS — Z024 Encounter for examination for driving license: Secondary | ICD-10-CM

## 2019-04-02 LAB — URINALYSIS
Bilirubin, UA: NEGATIVE
Glucose, UA: NEGATIVE
Ketones, UA: NEGATIVE
Leukocytes,UA: NEGATIVE
Nitrite, UA: NEGATIVE
Protein,UA: NEGATIVE
RBC, UA: NEGATIVE
Specific Gravity, UA: 1.02 (ref 1.005–1.030)
Urobilinogen, Ur: 0.2 mg/dL (ref 0.2–1.0)
pH, UA: 6 (ref 5.0–7.5)

## 2019-04-02 NOTE — Progress Notes (Signed)
[employer]- see scanned in document 

## 2019-04-29 ENCOUNTER — Other Ambulatory Visit: Payer: Self-pay

## 2019-04-30 ENCOUNTER — Encounter: Payer: Self-pay | Admitting: Family Medicine

## 2019-04-30 ENCOUNTER — Ambulatory Visit: Payer: BC Managed Care – PPO | Admitting: Family Medicine

## 2019-04-30 ENCOUNTER — Ambulatory Visit (INDEPENDENT_AMBULATORY_CARE_PROVIDER_SITE_OTHER): Payer: BC Managed Care – PPO

## 2019-04-30 VITALS — BP 131/83 | HR 73 | Temp 95.0°F | Ht 68.0 in | Wt 292.4 lb

## 2019-04-30 DIAGNOSIS — M170 Bilateral primary osteoarthritis of knee: Secondary | ICD-10-CM

## 2019-04-30 MED ORDER — METHYLPREDNISOLONE ACETATE 80 MG/ML IJ SUSP
80.0000 mg | Freq: Once | INTRAMUSCULAR | Status: AC
  Administered 2019-04-30: 17:00:00 80 mg via INTRAMUSCULAR

## 2019-04-30 MED ORDER — METHYLPREDNISOLONE ACETATE 80 MG/ML IJ SUSP
80.0000 mg | Freq: Once | INTRAMUSCULAR | Status: AC
  Administered 2019-04-30: 80 mg via INTRAMUSCULAR

## 2019-04-30 NOTE — Progress Notes (Signed)
BP 131/83   Pulse 73   Temp (!) 95 F (35 C) (Temporal)   Ht 5\' 8"  (1.727 m)   Wt 292 lb 6.4 oz (132.6 kg)   BMI 44.46 kg/m    Subjective:   Patient ID: [name] [name], male    DOB: 05-01-1971, 48 y.o.   MRN: [id number]  HPI: [name] [name] is a 48 y.o. male presenting on 04/30/2019 for Knee Pain (bilateral - Patient states it has been ongoing and would like injections)   HPI Patient is coming in complaining of bilateral knee pain that is been bothering him more over the past few months.  He says he is had this off and on for years.  He does say in 2010 he had a patellar tendon rupture that was repaired and then he gets arthritis in the knees and right now the right one is bothering him more than the left 1.  He does get some swelling.  He denies any popping or catching or giving way.  Relevant past medical, surgical, family and social history reviewed and updated as indicated. Interim medical history since our last visit reviewed. Allergies and medications reviewed and updated.  Review of Systems  Constitutional: Negative for chills and fever.  Respiratory: Negative for shortness of breath and wheezing.   Cardiovascular: Negative for chest pain and leg swelling.  Musculoskeletal: Positive for arthralgias. Negative for back pain, gait problem and myalgias.  Skin: Negative for rash.  All other systems reviewed and are negative.   Per HPI unless specifically indicated above     Objective:   BP 131/83   Pulse 73   Temp (!) 95 F (35 C) (Temporal)   Ht 5\' 8"  (1.727 m)   Wt 292 lb 6.4 oz (132.6 kg)   BMI 44.46 kg/m   Wt Readings from Last 3 Encounters:  04/30/19 292 lb 6.4 oz (132.6 kg)  04/02/19 293 lb (132.9 kg)  12/12/18 297 lb 3.2 oz (134.8 kg)    Physical Exam Vitals signs and nursing note reviewed.  Constitutional:      General: He is not in acute distress.    Appearance: He is well-developed. He is not diaphoretic.  Eyes:     General: No scleral icterus.     Conjunctiva/sclera: Conjunctivae normal.  Neck:     Thyroid: No thyromegaly.  Musculoskeletal: Normal range of motion.     Right knee: He exhibits effusion. He exhibits normal range of motion. Tenderness found. Medial joint line and lateral joint line tenderness noted.     Left knee: He exhibits effusion. He exhibits normal range of motion. Tenderness found. Medial joint line and lateral joint line tenderness noted.  Skin:    General: Skin is warm and dry.     Findings: No rash.  Neurological:     Mental Status: He is alert and oriented to person, place, and time.     Coordination: Coordination normal.  Psychiatric:        Behavior: Behavior normal.     Bilateral knee AP standing: Mild narrowing and arthritis, await final read from radiology  Knee injection: Consent form signed. Risk factors of bleeding and infection discussed with patient and patient is agreeable towards injection. Patient prepped with Betadine. Lateral approach towards injection used. Injected 80 mg of Depo-Medrol and 1 mL of 2% lidocaine. Patient tolerated procedure well and no side effects from noted. Minimal to no bleeding. Simple bandage applied after.   Assessment & Plan:   Problem List Items  Addressed This Visit    None    Visit Diagnoses    Bilateral primary osteoarthritis of knee    -  Primary   Relevant Medications   methylPREDNISolone acetate (DEPO-MEDROL) injection 80 mg (Completed)   methylPREDNISolone acetate (DEPO-MEDROL) injection 80 mg (Completed)   Other Relevant Orders   DG Knee Bilateral Standing AP       Follow up plan: Return if symptoms worsen or fail to improve.  Counseling provided for all of the vaccine components Orders Placed This Encounter  Procedures  . DG Knee Bilateral Standing AP    Arville CareJoshua Estanislado Surgeon, MD [facility] 04/30/2019, 5:07 PM

## 2019-06-15 ENCOUNTER — Other Ambulatory Visit: Payer: Self-pay | Admitting: Physician Assistant

## 2019-06-15 DIAGNOSIS — I10 Essential (primary) hypertension: Secondary | ICD-10-CM

## 2019-07-15 ENCOUNTER — Other Ambulatory Visit: Payer: Self-pay | Admitting: Physician Assistant

## 2019-07-15 DIAGNOSIS — I10 Essential (primary) hypertension: Secondary | ICD-10-CM

## 2019-07-23 ENCOUNTER — Other Ambulatory Visit: Payer: Self-pay | Admitting: *Deleted

## 2019-07-23 DIAGNOSIS — Z20822 Contact with and (suspected) exposure to covid-19: Secondary | ICD-10-CM

## 2019-07-23 LAB — NOVEL CORONAVIRUS, NAA: SARS-CoV-2, NAA: DETECTED

## 2019-07-25 LAB — NOVEL CORONAVIRUS, NAA: SARS-CoV-2, NAA: DETECTED — AB

## 2019-07-27 ENCOUNTER — Encounter: Payer: Self-pay | Admitting: Physician Assistant

## 2019-07-28 ENCOUNTER — Telehealth: Payer: Self-pay | Admitting: *Deleted

## 2019-07-28 DIAGNOSIS — U071 COVID-19: Secondary | ICD-10-CM | POA: Diagnosis not present

## 2019-07-28 DIAGNOSIS — R509 Fever, unspecified: Secondary | ICD-10-CM | POA: Diagnosis not present

## 2019-07-28 DIAGNOSIS — R0981 Nasal congestion: Secondary | ICD-10-CM | POA: Diagnosis not present

## 2019-07-28 DIAGNOSIS — R52 Pain, unspecified: Secondary | ICD-10-CM | POA: Diagnosis not present

## 2019-07-28 NOTE — Telephone Encounter (Signed)
Patient is calling to check on COVID result. Patient aware results are not back yet and I have sent message to [facility] asking for them to look and see if results are back.

## 2019-07-28 NOTE — Telephone Encounter (Signed)
Was informed by PCP office that pt. Was still waiting on results of COVID test done on 07/23/19.  Phone call to [place] to [employer].  Received faxed copy of lab result that showed COVID 19 was detected.  Abstracted the result into the chart.  Phone call to pt.  Advised that COVID 19 result is positive; the virus was "detected".  Pt reported he had onset of body aches on 07/23/19.  Denied fever, cough, shortness of breath, sore throat, or any other symptoms.  Advised on CDC guidelines for self isolation/ ending isolation.  Advised on safe practice guidelines of staying quarantined to room by himself, within his home, covering mouth/nose with cough or sneeze, frequent handwashing, disinfecting common touch areas of the home, and wearing mask if has to go into the main part of the home.  Advised for increased shortness of breath or cough to notify PCP, otherwise encouraged to rest, and hydrate.  Advised may take Tylenol or Ibuprofen for body aches, fever.  Encouraged to consult with pharmacist before taking OTC cough/ cold medication.  Advised when to seek emergency care.  Advised will notify [facility] and pt. Will receive f/u call from [facility] Nurse.   Questions answered.  Pt. Verb. Understanding.

## 2019-08-14 ENCOUNTER — Other Ambulatory Visit: Payer: Self-pay | Admitting: Physician Assistant

## 2019-08-14 DIAGNOSIS — I10 Essential (primary) hypertension: Secondary | ICD-10-CM

## 2019-09-13 ENCOUNTER — Other Ambulatory Visit: Payer: Self-pay | Admitting: Physician Assistant

## 2019-09-13 DIAGNOSIS — I10 Essential (primary) hypertension: Secondary | ICD-10-CM

## 2019-09-14 NOTE — Telephone Encounter (Signed)
[place]. NTBS 30 days given 08/14/19 

## 2019-10-20 ENCOUNTER — Ambulatory Visit (INDEPENDENT_AMBULATORY_CARE_PROVIDER_SITE_OTHER): Payer: BC Managed Care – PPO

## 2019-10-20 ENCOUNTER — Ambulatory Visit (INDEPENDENT_AMBULATORY_CARE_PROVIDER_SITE_OTHER): Payer: BC Managed Care – PPO | Admitting: Family Medicine

## 2019-10-20 DIAGNOSIS — R0789 Other chest pain: Secondary | ICD-10-CM

## 2019-10-20 DIAGNOSIS — R079 Chest pain, unspecified: Secondary | ICD-10-CM | POA: Diagnosis not present

## 2019-10-20 DIAGNOSIS — R5383 Other fatigue: Secondary | ICD-10-CM | POA: Diagnosis not present

## 2019-10-20 DIAGNOSIS — M79604 Pain in right leg: Secondary | ICD-10-CM | POA: Diagnosis not present

## 2019-10-20 DIAGNOSIS — E039 Hypothyroidism, unspecified: Secondary | ICD-10-CM | POA: Diagnosis not present

## 2019-10-20 NOTE — Progress Notes (Signed)
Telephone visit  Subjective: CC: fatigue PCP: Terald Sleeper, PA-C [name] [name] is a 49 y.o. male calls for telephone consult today. Patient provides verbal consent for consult held via phone.  Due to COVID-19 pandemic this visit was conducted virtually. This visit type was conducted due to national recommendations for restrictions regarding the COVID-19 Pandemic (e.g. social distancing, sheltering in place) in an effort to limit this patient's exposure and mitigate transmission in our community. All issues noted in this document were discussed and addressed.  A physical exam was not performed with this format.   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 Fatigue Patient reports 2-3 weeks of fatigue.  He reports pain in his right lower leg from the side to calf area.  He notes it varies in size compared to the left leg.  It does get red.  He notes tightness in his chest on the left side.  He has not done any unusual activity to cause MSK injury.  He has been observed breathing hard.  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  Denies hemoptysis, pleuritic chest pain.  Tolerating activity without difficulty.  He had COVID19 2 months ago.  Medical history is significant for thyroid disorder.  He is on Synthroid for this. ROS: Per HPI  No Known Allergies Past Medical History:  Diagnosis Date  . Hyperlipidemia   . Hypertension     Current Outpatient Medications:  .  amLODipine-benazepril (LOTREL) 5-20 MG capsule, TAKE 1 CAPSULE BY MOUTH DAILY. (PLEASE MAKE 6 MOS SEPT APPT), Disp: 30 capsule, Rfl: 0 .  atorvastatin (LIPITOR) 40 MG tablet, TAKE 1 TABLET (40 MG TOTAL) BY MOUTH DAILY., Disp: 90 tablet, Rfl: 1 .  levothyroxine (SYNTHROID, LEVOTHROID) 50 MCG tablet, Take 1 tablet (50 mcg total) by mouth daily., Disp: 90 tablet, Rfl: 3  DG Chest 2 View  Result Date: 10/20/2019 CLINICAL DATA:  Chest pain  EXAM: CHEST - 2 VIEW COMPARISON:  Chest radiograph and CT angiogram chest September 19, 2015 FINDINGS: Lungs are clear. Heart size and pulmonary vascularity are normal. No adenopathy. No pneumothorax. There is degenerative change in the thoracic spine. IMPRESSION: Lungs clear. Cardiac silhouette within normal limits. No evident adenopathy. Electronically Signed   By: Lowella Grip III M.D.   On: 10/20/2019 14:44   Pulmonary: Patient speaks in full sentences.  No audible wheezes or labored breathing. Calf girth: L 18", R 19"  Assessment/ Plan: 49 y.o. male   1. Acute leg pain, right Given the waxing waning nature of this unsure if this is related to a possible blood clot that is been going on for several weeks.  However I have seen similar presentation in other patients that have had pulmonary embolism.  For this reason we will proceed with at least a D-dimer to evaluate for this possibility.  We discussed that if D-dimer is negative, no further work-up needed.  However, if positive low threshold to get CTA plus or minus venous ultrasound.  He voiced understanding and will contact him as soon as I have this result in the morning.  He understands reasons for emergent evaluation emergency department. - D-dimer, quantitative (not at [facility])  2. Other chest pain Personal review of chest x-ray demonstrates normal heart size, no acute pulmonary infiltrates or evidence of pneumothorax.  Formal review by radiology notes negative chest x-ray.  Was essentially unchanged from previous EKG in 2016.  Nothing to suggest acute ischemia or pulmonary embolism on EKG (albeit only 20% of patients with PE present with classic S1Q3T3) - D-dimer, quantitative (not at [facility]) - DG Chest 2 View; Future - EKG 12-Lead  3. Fatigue, unspecified type Has not had labs done in a while.  Check thyroid panel, CBC and CMP for fatigue.  This is likely post viral fatigue syndrome given COVID-19 infection in October but will rule out  metabolic etiology. - CBC - CMP14+EGFR - Thyroid Panel With TSH  Start time: 8:53am (initial call); 3:01pm (reviewed results) End time: 9:04am (initial call); 3:03pm (reviewed results)  Total time spent on patient care (including telephone call/ virtual visit): 30 minutes  [place], [place] [phone]

## 2019-10-21 ENCOUNTER — Encounter (HOSPITAL_COMMUNITY): Payer: Self-pay

## 2019-10-21 ENCOUNTER — Other Ambulatory Visit: Payer: Self-pay

## 2019-10-21 ENCOUNTER — Other Ambulatory Visit: Payer: Self-pay | Admitting: Family Medicine

## 2019-10-21 ENCOUNTER — Ambulatory Visit (HOSPITAL_COMMUNITY)
Admission: RE | Admit: 2019-10-21 | Discharge: 2019-10-21 | Disposition: A | Discharge status: Home / Self Care | Payer: BC Managed Care – PPO | Source: Ambulatory Visit | Attending: Family Medicine | Admitting: Family Medicine

## 2019-10-21 ENCOUNTER — Observation Stay (HOSPITAL_COMMUNITY)
Admission: EM | Admit: 2019-10-21 | Discharge: 2019-10-22 | Disposition: A | Discharge status: Home / Self Care | Payer: BC Managed Care – PPO | Attending: Student in an Organized Health Care Education/Training Program | Admitting: Student in an Organized Health Care Education/Training Program

## 2019-10-21 DIAGNOSIS — E785 Hyperlipidemia, unspecified: Secondary | ICD-10-CM | POA: Diagnosis not present

## 2019-10-21 DIAGNOSIS — R0789 Other chest pain: Secondary | ICD-10-CM

## 2019-10-21 DIAGNOSIS — I2699 Other pulmonary embolism without acute cor pulmonale: Principal | ICD-10-CM | POA: Insufficient documentation

## 2019-10-21 DIAGNOSIS — M79604 Pain in right leg: Secondary | ICD-10-CM | POA: Insufficient documentation

## 2019-10-21 DIAGNOSIS — I4510 Unspecified right bundle-branch block: Secondary | ICD-10-CM | POA: Insufficient documentation

## 2019-10-21 DIAGNOSIS — Z79899 Other long term (current) drug therapy: Secondary | ICD-10-CM | POA: Diagnosis not present

## 2019-10-21 DIAGNOSIS — E039 Hypothyroidism, unspecified: Secondary | ICD-10-CM | POA: Diagnosis not present

## 2019-10-21 DIAGNOSIS — I10 Essential (primary) hypertension: Secondary | ICD-10-CM | POA: Insufficient documentation

## 2019-10-21 DIAGNOSIS — R0689 Other abnormalities of breathing: Secondary | ICD-10-CM | POA: Diagnosis not present

## 2019-10-21 DIAGNOSIS — M79662 Pain in left lower leg: Secondary | ICD-10-CM | POA: Diagnosis not present

## 2019-10-21 DIAGNOSIS — Z20822 Contact with and (suspected) exposure to covid-19: Secondary | ICD-10-CM | POA: Insufficient documentation

## 2019-10-21 DIAGNOSIS — I2609 Other pulmonary embolism with acute cor pulmonale: Secondary | ICD-10-CM | POA: Diagnosis not present

## 2019-10-21 DIAGNOSIS — M79661 Pain in right lower leg: Secondary | ICD-10-CM | POA: Diagnosis not present

## 2019-10-21 DIAGNOSIS — R079 Chest pain, unspecified: Secondary | ICD-10-CM | POA: Diagnosis not present

## 2019-10-21 DIAGNOSIS — Z6841 Body Mass Index (BMI) 40.0 and over, adult: Secondary | ICD-10-CM

## 2019-10-21 LAB — CMP14+EGFR
ALT: 37 IU/L (ref 0–44)
AST: 25 IU/L (ref 0–40)
Albumin/Globulin Ratio: 1.5 (ref 1.2–2.2)
Albumin: 4.4 g/dL (ref 4.0–5.0)
Alkaline Phosphatase: 71 IU/L (ref 39–117)
BUN/Creatinine Ratio: 12 (ref 9–20)
BUN: 13 mg/dL (ref 6–24)
Bilirubin Total: 0.4 mg/dL (ref 0.0–1.2)
CO2: 20 mmol/L (ref 20–29)
Calcium: 10.2 mg/dL (ref 8.7–10.2)
Chloride: 103 mmol/L (ref 96–106)
Creatinine, Ser: 1.13 mg/dL (ref 0.76–1.27)
GFR calc Af Amer: 88 mL/min/{1.73_m2} (ref >59)
GFR calc non Af Amer: 76 mL/min/{1.73_m2} (ref >59)
Globulin, Total: 3 g/dL (ref 1.5–4.5)
Glucose: 85 mg/dL (ref 65–99)
Potassium: 4.6 mmol/L (ref 3.5–5.2)
Sodium: 140 mmol/L (ref 134–144)
Total Protein: 7.4 g/dL (ref 6.0–8.5)

## 2019-10-21 LAB — TROPONIN I (HIGH SENSITIVITY)
Troponin I (High Sensitivity): 3 ng/L (ref <18)
Troponin I (High Sensitivity): <2 ng/L (ref <18)

## 2019-10-21 LAB — BASIC METABOLIC PANEL
Anion gap: 12 (ref 5–15)
BUN: 15 mg/dL (ref 6–20)
CO2: 20 mmol/L — ABNORMAL LOW (ref 22–32)
Calcium: 9.2 mg/dL (ref 8.9–10.3)
Chloride: 106 mmol/L (ref 98–111)
Creatinine, Ser: 1.26 mg/dL — ABNORMAL HIGH (ref 0.61–1.24)
GFR calc Af Amer: >60 mL/min (ref >60)
GFR calc non Af Amer: >60 mL/min (ref >60)
Glucose, Bld: 133 mg/dL — ABNORMAL HIGH (ref 70–99)
Potassium: 3.8 mmol/L (ref 3.5–5.1)
Sodium: 138 mmol/L (ref 135–145)

## 2019-10-21 LAB — HEPATIC FUNCTION PANEL
ALT: 55 U/L — ABNORMAL HIGH (ref 0–44)
AST: 38 U/L (ref 15–41)
Albumin: 3.8 g/dL (ref 3.5–5.0)
Alkaline Phosphatase: 55 U/L (ref 38–126)
Bilirubin, Direct: <0.1 mg/dL (ref 0.0–0.2)
Total Bilirubin: 0.6 mg/dL (ref 0.3–1.2)
Total Protein: 6.9 g/dL (ref 6.5–8.1)

## 2019-10-21 LAB — CBC
HCT: 45.2 % (ref 39.0–52.0)
Hematocrit: 45.3 % (ref 37.5–51.0)
Hemoglobin: 15.2 g/dL (ref 13.0–17.7)
Hemoglobin: 14.9 g/dL (ref 13.0–17.0)
MCH: 28.8 pg (ref 26.6–33.0)
MCH: 28.9 pg (ref 26.0–34.0)
MCHC: 33.6 g/dL (ref 31.5–35.7)
MCHC: 33 g/dL (ref 30.0–36.0)
MCV: 86 fL (ref 79–97)
MCV: 87.8 fL (ref 80.0–100.0)
Platelets: 256 10*3/uL (ref 150–450)
Platelets: 231 10*3/uL (ref 150–400)
RBC: 5.27 x10E6/uL (ref 4.14–5.80)
RBC: 5.15 MIL/uL (ref 4.22–5.81)
RDW: 14.6 % (ref 11.6–15.4)
RDW: 14.5 % (ref 11.5–15.5)
WBC: 10.2 10*3/uL (ref 3.4–10.8)
WBC: 9.4 10*3/uL (ref 4.0–10.5)
nRBC: 0 % (ref 0.0–0.2)

## 2019-10-21 LAB — RESPIRATORY PANEL BY RT PCR (FLU A&B, COVID)
Influenza A by PCR: NEGATIVE
Influenza B by PCR: NEGATIVE
SARS Coronavirus 2 by RT PCR: NEGATIVE

## 2019-10-21 LAB — THYROID PANEL WITH TSH
Free Thyroxine Index: 1 — ABNORMAL LOW (ref 1.2–4.9)
T3 Uptake Ratio: 25 % (ref 24–39)
T4, Total: 3.9 ug/dL — ABNORMAL LOW (ref 4.5–12.0)
TSH: 9.83 u[IU]/mL — ABNORMAL HIGH (ref 0.450–4.500)

## 2019-10-21 LAB — PROTIME-INR
INR: 1 (ref 0.8–1.2)
Prothrombin Time: 12.6 seconds (ref 11.4–15.2)

## 2019-10-21 LAB — ANTITHROMBIN III: AntiThromb III Func: 102 % (ref 75–120)

## 2019-10-21 LAB — HIV ANTIBODY (ROUTINE TESTING W REFLEX): HIV Screen 4th Generation wRfx: NONREACTIVE

## 2019-10-21 LAB — D-DIMER, QUANTITATIVE: D-DIMER: 1.4 mg/L FEU — ABNORMAL HIGH (ref 0.00–0.49)

## 2019-10-21 MED ORDER — IOHEXOL 350 MG/ML SOLN
100.0000 mL | Freq: Once | INTRAVENOUS | Status: AC | PRN
  Administered 2019-10-21: 15:00:00 100 mL via INTRAVENOUS

## 2019-10-21 MED ORDER — HEPARIN (PORCINE) 25000 UT/250ML-% IV SOLN
1600.0000 [IU]/h | INTRAVENOUS | Status: DC
  Filled 2019-10-21: qty 250

## 2019-10-21 MED ORDER — ACETAMINOPHEN 325 MG PO TABS: 650.0000 mg | ORAL_TABLET | Freq: Four times a day (QID) | ORAL | Status: DC | PRN

## 2019-10-21 MED ORDER — ACETAMINOPHEN 650 MG RE SUPP: 650.0000 mg | Freq: Four times a day (QID) | RECTAL | Status: DC | PRN

## 2019-10-21 MED ORDER — ENOXAPARIN SODIUM 150 MG/ML ~~LOC~~ SOLN
130.0000 mg | Freq: Once | SUBCUTANEOUS | Status: AC
  Administered 2019-10-21: 130 mg via SUBCUTANEOUS
  Filled 2019-10-21: qty 0.87

## 2019-10-21 MED ORDER — ATORVASTATIN CALCIUM 40 MG PO TABS
40.0000 mg | ORAL_TABLET | Freq: Every day | ORAL | Status: DC
  Administered 2019-10-22: 40 mg via ORAL
  Filled 2019-10-21: qty 1

## 2019-10-21 MED ORDER — APIXABAN 5 MG PO TABS: 10.0000 mg | ORAL_TABLET | Freq: Two times a day (BID) | ORAL | Status: DC

## 2019-10-21 MED ORDER — LACTATED RINGERS IV BOLUS
1000.0000 mL | Freq: Once | INTRAVENOUS | Status: AC
  Administered 2019-10-21: 22:00:00 1000 mL via INTRAVENOUS

## 2019-10-21 MED ORDER — POLYETHYLENE GLYCOL 3350 17 G PO PACK: 17.0000 g | PACK | Freq: Every day | ORAL | Status: DC | PRN

## 2019-10-21 MED ORDER — HEPARIN BOLUS VIA INFUSION
5000.0000 [IU] | Freq: Once | INTRAVENOUS | Status: DC
  Filled 2019-10-21: qty 5000

## 2019-10-21 MED ORDER — AMLODIPINE BESY-BENAZEPRIL HCL 5-20 MG PO CAPS: 1.0000 | ORAL_CAPSULE | Freq: Every day | ORAL | Status: DC

## 2019-10-21 MED ORDER — LIFITEGRAST 5 % OP SOLN: 1.0000 [drp] | Freq: Two times a day (BID) | OPHTHALMIC | Status: DC

## 2019-10-21 NOTE — H&P (Addendum)
Date: 10/21/2019               Patient Name:  [name] [name] MRN: [id number]  DOB: 22-May-1971 Age / Sex: 49 y.o., male   PCP: Terald Sleeper, PA-C         Medical Service: Internal Medicine Teaching Service         Attending Physician: Dr. Evette Doffing, Mallie Mussel, *    First Contact: Dr. Charleen Kirks Pager: [phone]  Second Contact: Dr. Koleen Distance Pager: [phone]       After Hours (After 5p/  First Contact Pager: [phone]  weekends / holidays): Second Contact Pager: [phone]    Chief Complaint: Chest pain  History of Present Illness:  [name] [name] is a 49 yo M w/ PMH of HTN, hyperlipidemia, and hypothyroidism presenting with chest pain. Patient reports that he has had 5-6 weeks of achy pain in his right thigh and calf, that comes and goes and is worse when resting. He notes associated right leg swelling. Patient also reports 4-5 days of chest pressure that feels like indigestion, is intermittent and last couple of minutes at a time. Pain did not improve with antacids. Pain is not associated with deep breathing or position. Patient denies dyspnea but states his wife noticed he has been breathing heavier than usual. Pain is also associated with general malaise (described as "not feeling myself"). Patient reports occasional light-headedness, denies headaches, changes in vision, changes in strength or sensation.  Patient works for [employer] and does work with [employer]. He denies long drives (longest is 45 minutes to 1 hour). Patient denies any recent medication changes, history of blood clots, bleeding events, or cancer. Patient denies any recent travel or surgeries. Patient denies family history of blood clots except for his mother who had a clot while in treatment for breast cancer. Patient denies other family history of cancer  Patient was asked about his feelings on taking a twice daily medication and he states that this would not be an issue.  Meds: * Amlodipine-benazepril  5-20 mg daily * Atorvastatin 40mg  daily * Levothyroxine listed in medication list and in last clinic visit note. However, patient states that a while ago he and his provider decided to stop that medication  Allergies: * Denies allergies to medications  Past Medical History:  Diagnosis Date  . Hyperlipidemia   . 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 Works on [employer] * Deneis tobacco use.  * Mentions drinking one 6 pack weekly - occassionally more on weekend.  * Denies any non-prescription/non-OTC substance use.  * Lives with wife in [place]. PA Particia Nearing is PCP. Two kids, 16 and 64 years of age. Son works in Engineer, petroleum. Daughter graduated as Marine scientist from [employer].   Review of Systems: A complete ROS was negative except as per HPI.  Physical Exam: Blood pressure 119/68, pulse 68, temperature 98 F (36.7 C), resp. rate 20, height 5\' 8"  (1.727 m), weight 129.3 kg, SpO2 96 %. Physical Exam  Constitutional: He is well-developed, well-nourished, and in no distress.  HENT:  Head: Normocephalic and atraumatic.  Eyes: EOM are normal. Right eye exhibits no discharge. Left eye exhibits no discharge.  Neck: No tracheal deviation present.  Cardiovascular: Normal rate and regular rhythm. Exam reveals no gallop and no friction rub.  No murmur heard. Pulmonary/Chest: Effort normal and breath sounds normal. No respiratory distress. He has no wheezes. He has no rales.  Abdominal: Soft. He exhibits no distension. There is no abdominal tenderness. There is no rebound and no guarding.  Musculoskeletal:        General: No tenderness, deformity or edema. Normal range of motion.     Cervical back: Normal range of motion.      Comments: No pain in right calf. No swelling/asymmetry appreciated  Neurological: He is alert. No cranial nerve deficit. Coordination normal.  5/5 strength in bilateral upper and lower extremities  Skin: Skin is warm and dry. No rash noted. He is not diaphoretic. No erythema.  Psychiatric: Memory and judgment normal.    EKG: personally reviewed my interpretation is normal sinus rhythm with left axis deviation and right bundle-branch block. S wave present in lead 1, no Q wave or T-wave inversion in lead 3.  CXR: personally reviewed my interpretation is no acute cardiopulmonary process  Assessment & Plan by Problem: Active Problems:   Pulmonary embolism [facility])   [name] [name] is a 49 yo M w/ PMH of HTN, hyperlipidemia, and hypothyroidism presenting with 4-5 days of chest discomfort in the setting of 5-6 weeks of right leg pain who was found on initial workup to have a submassive pulmonary embolism.  # Pulmonary Embolism:  Patient seen on 1/19 by outpatient provider for leg pain/chest discomfort. D-dimer was elevated at 1.40 and CTA was obtained which revealed moderate-volume bilateral acute pulmonary emboli primary at the segmental and subsegmental level involving all 3 right-sided lobes as well as the lingula and left lower lobe. The right ventricular to left ventricular ratio (1.2)  indicates right heart strain/submassive pulmonary embolus. Patient is hemodynamically stable (BP 119/68), saturating well on room air, without respiratory distress or significant pain.  Patient with Hestia score of 0 and Simplified PESI score of 0 indicating low-risk PE. While CTA report shows evidence for submassive PE (right heart strain), patient is not currently a candidate for thrombolysis. Patient would not qualify as an intermediate-high risk hemodynamically stable patient as the right heart strain is not accompanied by elevated troponin or evidence for clinical deterioration.   Furthermore, EKGs from as far  back as January 2016 are with evidence of RBBB which suggests that the right heart strain findings may be chronic. On my review of the CTA from December 2016, I measure a RV/LV ratio of 1.20 (diagnostic for RV strain, and same as current study) in the absence of any pulmonary emboli. The findings of right heart strain may be independent of the pulmonary emboli.  * BNP pending - can inform the risk of right heart strain * Therapeutic Lovenox - The decision to start parenteral anticoagulation over DOAC was made in consultation with pharmacy * Continuous pulse oximetry and telemetry * Tylenol PRN for pain  # Elevated Creatinine: Creatinine of 1.26 from baseline of 1.13 yesterday. Likely 2/2 to IV contrast * LR bolus x1  # Hypertension:  * Will hold home BP medications in setting of normal BP, elevated creatinine, and extensive PE  # Hyperlipidemia: * Atorvastatin 40 mg daily   Diet: Heart healthy Code Status:  FULL CODE Dispo: Admit patient to Observation with expected length of stay less than 2 midnights.  Signed: Katherine Roan, MD 10/21/2019, 8:53 PM  Pager: [phone]

## 2019-10-21 NOTE — Progress Notes (Signed)
ANTICOAGULATION CONSULT NOTE - Initial Consult  Pharmacy Consult for heparin Indication: pulmonary embolus   Patient Measurements: Height: 5\' 8"  (172.7 cm) Weight: 285 lb (129.3 kg) IBW/kg (Calculated) : 68.4 Heparin Dosing Weight: 98.6 kg  Vital Signs: Temp: 98 F (36.7 C) (01/20 1745) BP: 131/84 (01/20 1845) Pulse Rate: 79 (01/20 1845)  Labs: Recent Labs    10/20/19 1455 10/21/19 1750  HGB 15.2 14.9  HCT 45.3 45.2  PLT 256 231  CREATININE 1.13 1.26*  TROPONINIHS  --  3    Estimated Creatinine Clearance: 93.1 mL/min (A) (by C-G formula based on SCr of 1.26 mg/dL (H)).   Medical History: Past Medical History:  Diagnosis Date  . Hyperlipidemia   . Hypertension      Assessment: 49 yo male with acute PE in R upper lobe. He does have an elevated RV/LV ratio at 1.2. We will begin a heparin infusion. H/h plts wnl, SCr 1.26. He is not on anticoagulation prior to admission.   Goal of Therapy:  Heparin level 0.3-0.7 units/ml Monitor platelets by anticoagulation protocol: Yes    Plan:  -Heparin bolus 5000 units x1 then 1600 units/hr -Daily HL, CBC -First level tomorrow   54 10/21/2019,7:11 PM

## 2019-10-21 NOTE — ED Notes (Signed)
Darrill Vreeland daughter [phone] looking for an update on the patient

## 2019-10-21 NOTE — ED Triage Notes (Signed)
Pt sent by PCP for evaluation and admission for PE. Pt reports chest pressure for the past 6 days which sent him to his PCP. Pt also reports bilateral leg pain, worse on the right. Vascular US done in office, negative for DVT. Pt a.o, resp e.u, denies SOB at this time.

## 2019-10-21 NOTE — ED Notes (Signed)
Ordered eye drops not given at this time.  Awaiting verification and delivery from pharmacy

## 2019-10-21 NOTE — ED Provider Notes (Addendum)
[facility] EMERGENCY DEPARTMENT Provider Note   CSN: [id number] Arrival date & time: 10/21/19  1731     History Chief Complaint  Patient presents with  . Chest Pain  . Leg Pain    [name] [name] is a 49 y.o. male who presents emergency department for pulmonary embolus.  Patient was seen yesterday through telephone visit by Dr. Nadine Counts.  He is complaining of 2 to 3 weeks of increased fatigue, bilateral lower extremity pain right worse than left.  The patient had outpatient DVT study which was negative however his CT angiogram showed right upper lobe bilateral segmental acute PE with right ventricular to left ventricular ratio of 1.2 consistent with a submassive pulmonary embolus.  Patient states that he is felt a little bit off.  He was sitting next to his wife on the couch 2 days ago and she noticed that he was breathing hard.  He denies a personal history of PE or DVT.  He has had no recent surgeries, confinement, hemoptysis.  He denies a history of cancer.  His mother has had several pulmonary emboli however she is always gotten them during episodes of cancer.  Patient does drive for living and states that he spends about 70% of his time seated.  She denies any recent flulike symptoms. HPI     Past Medical History:  Diagnosis Date  . Hyperlipidemia   . Hypertension     Patient Active Problem List   Diagnosis Date Noted  . Pulmonary embolism (HCC) 10/21/2019  . Hypothyroidism 02/18/2018  . BMI 40.0-44.9, adult (HCC) 08/20/2017  . Hyperlipidemia 10/19/2015  . RBBB 09/20/2015  . Obstructive sleep apnea 05/14/2014  . HTN (hypertension) 04/28/2013    Past Surgical History:  Procedure Laterality Date  . BICEPS TENDON REPAIR    . KNEE SURGERY Left 2003       Family History  Problem Relation Age of Onset  . Cancer Mother        breast cancer  . Heart attack Father     Social History   Tobacco Use  . Smoking status: Never Smoker  . Smokeless  tobacco: Never Used  Substance Use Topics  . Alcohol use: No  . Drug use: No    Home Medications Prior to Admission medications   Medication Sig Start Date End Date Taking? Authorizing Provider  amLODipine-benazepril (LOTREL) 5-20 MG capsule Take 1 capsule by mouth daily. 07/15/19  Yes [provider]  atorvastatin (LIPITOR) 40 MG tablet TAKE 1 TABLET (40 MG TOTAL) BY MOUTH DAILY. Patient taking differently: Take 40 mg by mouth daily.  02/18/18  Yes Remus Loffler, PA-C  XIIDRA 5 % SOLN Place 1 drop into both eyes 2 (two) times daily.  10/19/19  Yes [provider]  levothyroxine (SYNTHROID, LEVOTHROID) 50 MCG tablet Take 1 tablet (50 mcg total) by mouth daily. Patient not taking: Reported on 10/21/2019 02/18/18   Remus Loffler, PA-C    Allergies    Patient has no known allergies.  Review of Systems   Review of Systems Ten systems reviewed and are negative for acute change, except as noted in the HPI.   Physical Exam Updated Vital Signs BP 119/66 (BP Location: Right Arm)   Pulse 72   Temp 98 F (36.7 C)   Resp 19   Ht 5\' 8"  (1.727 m)   Wt 129.3 kg   SpO2 97%   BMI 43.33 kg/m   Physical Exam Vitals and nursing note reviewed.  Constitutional:  General: He is not in acute distress.    Appearance: He is well-developed. He is not diaphoretic.  HENT:     Head: Normocephalic and atraumatic.  Eyes:     General: No scleral icterus.    Conjunctiva/sclera: Conjunctivae normal.  Cardiovascular:     Rate and Rhythm: Normal rate and regular rhythm.     Heart sounds: Normal heart sounds.  Pulmonary:     Effort: Pulmonary effort is normal. No respiratory distress.     Breath sounds: Normal breath sounds.  Abdominal:     Palpations: Abdomen is soft.     Tenderness: There is no abdominal tenderness.  Musculoskeletal:     Cervical back: Normal range of motion and neck supple.  Skin:    General: Skin is warm and dry.  Neurological:     Mental Status: He is  alert.  Psychiatric:        Behavior: Behavior normal.     ED Results / Procedures / Treatments   Labs (all labs ordered are listed, but only abnormal results are displayed) Labs Reviewed  BASIC METABOLIC PANEL - Abnormal; Notable for the following components:      Result Value   CO2 20 (*)    Glucose, Bld 133 (*)    Creatinine, Ser 1.26 (*)    All other components within normal limits  HEPATIC FUNCTION PANEL - Abnormal; Notable for the following components:   ALT 55 (*)    All other components within normal limits  RESPIRATORY PANEL BY RT PCR (FLU A&B, COVID)  CBC  PROTIME-INR  ANTITHROMBIN III  HIV ANTIBODY (ROUTINE TESTING W REFLEX)  PROTEIN C ACTIVITY  PROTEIN C, TOTAL  PROTEIN S ACTIVITY  PROTEIN S, TOTAL  LUPUS ANTICOAGULANT PANEL  BETA-2-GLYCOPROTEIN I ABS, IGG/M/A  HOMOCYSTEINE  FACTOR 5 LEIDEN  PROTHROMBIN GENE MUTATION  CARDIOLIPIN ANTIBODIES, IGG, IGM, IGA  CBC  COMPREHENSIVE METABOLIC PANEL  BRAIN NATRIURETIC PEPTIDE  TROPONIN I (HIGH SENSITIVITY)  TROPONIN I (HIGH SENSITIVITY)    EKG EKG Interpretation  Date/Time:  Wednesday October 21 2019 17:47:17 EST Ventricular Rate:  81 PR Interval:  168 QRS Duration: 162 QT Interval:  430 QTC Calculation: 499 R Axis:   -54 Text Interpretation: Normal sinus rhythm Left axis deviation Right bundle branch block No previous tracing Confirmed by Gwyneth Sprout ([id number]) on 10/21/2019 7:19:51 PM   Radiology DG Chest 2 View  Result Date: 10/20/2019 CLINICAL DATA:  Chest pain EXAM: CHEST - 2 VIEW COMPARISON:  Chest radiograph and CT angiogram chest September 19, 2015 FINDINGS: Lungs are clear. Heart size and pulmonary vascularity are normal. No adenopathy. No pneumothorax. There is degenerative change in the thoracic spine. IMPRESSION: Lungs clear. Cardiac silhouette within normal limits. No evident adenopathy. Electronically Signed   By: Bretta Bang III M.D.   On: 10/20/2019 14:44   CT Angio Chest W/Cm &/Or  Wo Cm  Addendum Date: 10/21/2019   ADDENDUM REPORT: 10/21/2019 15:42 ADDENDUM: The original report was by Dr. Gaylyn Rong. The following addendum is by Dr. Gaylyn Rong: Critical Value/emergent results were called by telephone at the time of interpretation on 10/21/2019 at 3:36 pm to provider Dr. Delynn Flavin , who verbally acknowledged these results. I phoned [facility] radiology personnel and instructed them to escort the patient to the emergency department as per Dr. Delynn Flavin instructions. Electronically Signed   By: Gaylyn Rong M.D.   On: 10/21/2019 15:42   Result Date: 10/21/2019 CLINICAL DATA:  Elevated D-dimer level, 2-3 week history of  left-sided chest pain. Right lower extremity pain and swelling. EXAM: CT ANGIOGRAPHY CHEST WITH CONTRAST TECHNIQUE: Multidetector CT imaging of the chest was performed using the standard protocol during bolus administration of intravenous contrast. Multiplanar CT image reconstructions and MIPs were obtained to evaluate the vascular anatomy. CONTRAST:  115mL OMNIPAQUE IOHEXOL 350 MG/ML SOLN COMPARISON:  09/20/2015 and lower extremity ultrasound of 10/21/2019 FINDINGS: Cardiovascular: There is a moderate volume of bilateral acute pulmonary embolus primarily at the segmental and subsegmental level involving all 3 right-sided lobes as well as the lingula and left lower lobe. In the right upper lobe there is some lobar pulmonary embolus for example on image 102/5. In some areas clot demonstrates some peripheral location, for example in the right lower lobe on image 160/5, suggesting an early chronic component, although most of the thrombus is judged acute. The right ventricular to left ventricular ratio is 1.2, compatible with submassive pulmonary embolus. Mild cardiomegaly. Atherosclerotic calcification of the aortic arch and left anterior descending coronary artery. Mediastinum/Nodes: Mild distal esophageal wall thickening, the most common cause of be  esophagitis. Adjacent small paraesophageal lymph nodes noted. Lungs/Pleura: Linear subsegmental atelectasis is present in both lower lobes. Upper Abdomen: Suspected diffuse hepatic steatosis. Musculoskeletal: Thoracic spondylosis. Review of the MIP images confirms the above findings. IMPRESSION: 1. Right upper lobe lobar bilateral segmental acute pulmonary embolus, with right ventricular to left ventricular ratio 1.2. Positive for acute PE with CT evidence of right heart strain (RV/LV Ratio = 1.2) consistent with at least submassive (intermediate risk) PE. The presence of right heart strain has been associated with an increased risk of morbidity and mortality. Please activate Code PE by paging [phone]. 2. Mild cardiomegaly. 3. Mild distal esophageal wall thickening, the most common cause of be esophagitis. Adjacent small paraesophageal lymph nodes. 4. Suspected diffuse hepatic steatosis. Radiology assistant personnel have been notified to put me in telephone contact with the referring physician or the referring physician's clinical representative in order to discuss these findings. Once this communication is established I will issue an addendum to this report for documentation purposes. Electronically Signed: By: Van Clines M.D. On: 10/21/2019 15:29   US Venous Img Lower Unilateral Right  Result Date: 10/21/2019 CLINICAL DATA:  Pain x2 weeks EXAM: RIGHT LOWER EXTREMITY VENOUS DOPPLER ULTRASOUND TECHNIQUE: Gray-scale sonography with compression, as well as color and duplex ultrasound, were performed to evaluate the deep venous system(s) from the level of the common femoral vein through the popliteal and proximal calf veins. COMPARISON:  None FINDINGS: VENOUS Normal compressibility of the common femoral, superficial femoral, and popliteal veins, as well as the visualized calf veins. Visualized portions of profunda femoral vein and great saphenous vein unremarkable. No filling defects to suggest DVT on  grayscale or color Doppler imaging. Doppler waveforms show normal direction of venous flow, normal respiratory phasicity and response to augmentation. Limited views of the contralateral common femoral vein are unremarkable. OTHER None. Limitations: none IMPRESSION: No femoropopliteal DVT nor evidence of DVT within the visualized calf veins. If clinical symptoms are inconsistent or if there are persistent or worsening symptoms, further imaging (possibly involving the iliac veins) may be warranted. Electronically Signed   By: Lucrezia Europe M.D.   On: 10/21/2019 15:06    Procedures .Critical Care Performed by: Margarita Mail, PA-C Authorized by: Margarita Mail, PA-C   Critical care provider statement:    Critical care time (minutes):  30   Critical care was necessary to treat or prevent imminent or life-threatening deterioration of the following  conditions: Submassive pulmonary embolus requiring IV anticoagulation.   Critical care was time spent personally by me on the following activities:  Discussions with consultants, evaluation of patient's response to treatment, examination of patient, ordering and performing treatments and interventions, ordering and review of laboratory studies, ordering and review of radiographic studies, pulse oximetry, re-evaluation of patient's condition, obtaining history from patient or surrogate and review of old charts   (including critical care time)  Medications Ordered in ED Medications  atorvastatin (LIPITOR) tablet 40 mg (has no administration in time range)  Lifitegrast 5 % SOLN 1 drop (has no administration in time range)  acetaminophen (TYLENOL) tablet 650 mg (has no administration in time range)    Or  acetaminophen (TYLENOL) suppository 650 mg (has no administration in time range)  polyethylene glycol (MIRALAX / GLYCOLAX) packet 17 g (has no administration in time range)  enoxaparin (LOVENOX) injection 130 mg (has no administration in time range)    lactated ringers bolus 1,000 mL (1,000 mLs Intravenous New Bag/Given 10/21/19 2212)    ED Course  I have reviewed the triage vital signs and the nursing notes.  Pertinent labs & imaging results that were available during my care of the patient were reviewed by me and considered in my medical decision making (see chart for details).    MDM Rules/Calculators/A&P                      49 year old male here with outpatient diagnosed submassive PE with RV strain.  Patient has been hemodynamically stable, breathing at a normal rate, without chest pain or hemoptysis or tachycardia.  He is afebrile.  I have discussed the case with the internal medicine resident service who will admit the patient.  He has been given IV heparin here.  I have ordered a hypercoagulability panel.  Patient has a negative troponin, PT/INR was within normal limits.  Patient's BNP shows mildly elevated glucose and serum creatinine without significance.  CBC is without abnormality.  Patient will be admitted to the internal medicine teaching service.  He is stable for admission. Final Clinical Impression(s) / ED Diagnoses Final diagnoses:  Acute pulmonary embolism with acute cor pulmonale, unspecified pulmonary embolism type [facility])    Rx / DC Orders ED Discharge Orders    None       Arthor Captain, PA-C 10/21/19 2322    Arthor Captain, PA-C 10/21/19 2324    Gwyneth Sprout, MD 10/21/19 2330

## 2019-10-22 ENCOUNTER — Other Ambulatory Visit: Payer: Self-pay | Admitting: Physician Assistant

## 2019-10-22 ENCOUNTER — Encounter (HOSPITAL_COMMUNITY): Payer: Self-pay | Admitting: Student in an Organized Health Care Education/Training Program

## 2019-10-22 DIAGNOSIS — Z8616 Personal history of COVID-19: Secondary | ICD-10-CM

## 2019-10-22 DIAGNOSIS — I700 Atherosclerosis of aorta: Secondary | ICD-10-CM

## 2019-10-22 DIAGNOSIS — I10 Essential (primary) hypertension: Secondary | ICD-10-CM | POA: Diagnosis not present

## 2019-10-22 DIAGNOSIS — I4510 Unspecified right bundle-branch block: Secondary | ICD-10-CM

## 2019-10-22 DIAGNOSIS — I2699 Other pulmonary embolism without acute cor pulmonale: Secondary | ICD-10-CM | POA: Diagnosis not present

## 2019-10-22 DIAGNOSIS — Z7901 Long term (current) use of anticoagulants: Secondary | ICD-10-CM

## 2019-10-22 DIAGNOSIS — N179 Acute kidney failure, unspecified: Secondary | ICD-10-CM

## 2019-10-22 DIAGNOSIS — Z79899 Other long term (current) drug therapy: Secondary | ICD-10-CM

## 2019-10-22 DIAGNOSIS — E785 Hyperlipidemia, unspecified: Secondary | ICD-10-CM

## 2019-10-22 LAB — COMPREHENSIVE METABOLIC PANEL
ALT: 50 U/L — ABNORMAL HIGH (ref 0–44)
AST: 33 U/L (ref 15–41)
Albumin: 3.3 g/dL — ABNORMAL LOW (ref 3.5–5.0)
Alkaline Phosphatase: 48 U/L (ref 38–126)
Anion gap: 10 (ref 5–15)
BUN: 14 mg/dL (ref 6–20)
CO2: 24 mmol/L (ref 22–32)
Calcium: 8.9 mg/dL (ref 8.9–10.3)
Chloride: 107 mmol/L (ref 98–111)
Creatinine, Ser: 1.27 mg/dL — ABNORMAL HIGH (ref 0.61–1.24)
GFR calc Af Amer: >60 mL/min (ref >60)
GFR calc non Af Amer: >60 mL/min (ref >60)
Glucose, Bld: 109 mg/dL — ABNORMAL HIGH (ref 70–99)
Potassium: 4.4 mmol/L (ref 3.5–5.1)
Sodium: 141 mmol/L (ref 135–145)
Total Bilirubin: 0.7 mg/dL (ref 0.3–1.2)
Total Protein: 6.2 g/dL — ABNORMAL LOW (ref 6.5–8.1)

## 2019-10-22 LAB — CBC
HCT: 40.4 % (ref 39.0–52.0)
Hemoglobin: 13.6 g/dL (ref 13.0–17.0)
MCH: 29.4 pg (ref 26.0–34.0)
MCHC: 33.7 g/dL (ref 30.0–36.0)
MCV: 87.4 fL (ref 80.0–100.0)
Platelets: 209 10*3/uL (ref 150–400)
RBC: 4.62 MIL/uL (ref 4.22–5.81)
RDW: 14.5 % (ref 11.5–15.5)
WBC: 8.8 10*3/uL (ref 4.0–10.5)
nRBC: 0 % (ref 0.0–0.2)

## 2019-10-22 LAB — BRAIN NATRIURETIC PEPTIDE: B Natriuretic Peptide: 15.2 pg/mL (ref 0.0–100.0)

## 2019-10-22 LAB — HOMOCYSTEINE: Homocysteine: 9.4 umol/L (ref 0.0–14.5)

## 2019-10-22 MED ORDER — INFLUENZA VAC SPLIT QUAD 0.5 ML IM SUSY: 0.5000 mL | PREFILLED_SYRINGE | INTRAMUSCULAR | Status: DC

## 2019-10-22 MED ORDER — RIVAROXABAN (XARELTO) VTE STARTER PACK (15 & 20 MG): ORAL_TABLET | ORAL | 0 refills | Status: DC

## 2019-10-22 MED ORDER — APIXABAN 5 MG PO TABS
10.0000 mg | ORAL_TABLET | Freq: Two times a day (BID) | ORAL | Status: DC
  Administered 2019-10-22: 10 mg via ORAL
  Filled 2019-10-22: qty 2

## 2019-10-22 MED FILL — XARELTO STARTER PACK: 15 & 20 | 30 days supply | Qty: 51 | Fill #0

## 2019-10-22 NOTE — Discharge Instructions (Signed)
Pulmonary Embolism  A pulmonary embolism (PE) is a sudden blockage or decrease of blood flow in one or both lungs. Most blockages come from a blood clot that forms in the vein of a lower leg, thigh, or arm (deep vein thrombosis, DVT) and travels to the lungs. A clot is blood that has thickened into a gel or solid. PE is a dangerous and life-threatening condition that needs to be treated right away. What are the causes? This condition is usually caused by a blood clot that forms in a vein and moves to the lungs. In rare cases, it may be caused by air, fat, part of a tumor, or other tissue that moves through the veins and into the lungs. What increases the risk? The following factors may make you more likely to develop this condition:  Experiencing a traumatic injury, such as breaking a hip or leg.  Having: ? A spinal cord injury. ? Orthopedic surgery, especially hip or knee replacement. ? Any major surgery. ? A stroke. ? DVT. ? Blood clots or blood clotting disease. ? Long-term (chronic) lung or heart disease. ? Cancer treated with chemotherapy. ? A central venous catheter.  Taking medicines that contain estrogen. These include birth control pills and hormone replacement therapy.  Being: ? Pregnant. ? In the period of time after your baby is delivered (postpartum). ? Older than age 60. ? Overweight. ? A smoker, especially if you have other risks. What are the signs or symptoms? Symptoms of this condition usually start suddenly and include:  Shortness of breath during activity or at rest.  Coughing, coughing up blood, or coughing up blood-tinged mucus.  Chest pain that is often worse with deep breaths.  Rapid or irregular heartbeat.  Feeling light-headed or dizzy.  Fainting.  Feeling anxious.  Fever.  Sweating.  Pain and swelling in a leg. This is a symptom of DVT, which can lead to PE. How is this diagnosed? This condition may be diagnosed based on:  Your medical  history.  A physical exam.  Blood tests.  CT pulmonary angiogram. This test checks blood flow in and around your lungs.  Ventilation-perfusion scan, also called a lung VQ scan. This test measures air flow and blood flow to the lungs.  An ultrasound of the legs. How is this treated? Treatment for this condition depends on many factors, such as the cause of your PE, your risk for bleeding or developing more clots, and other medical conditions you have. Treatment aims to remove, dissolve, or stop blood clots from forming or growing larger. Treatment may include:  Medicines, such as: ? Blood thinning medicines (anticoagulants) to stop clots from forming. ? Medicines that dissolve clots (thrombolytics).  Procedures, such as: ? Using a flexible tube to remove a blood clot (embolectomy) or to deliver medicine to destroy it (catheter-directed thrombolysis). ? Inserting a filter into a large vein that carries blood to the heart (inferior vena cava). This filter (vena cava filter) catches blood clots before they reach the lungs. ? Surgery to remove the clot (surgical embolectomy). This is rare. You may need a combination of immediate, long-term (up to 3 months after diagnosis), and extended (more than 3 months after diagnosis) treatments. Your treatment may continue for several months (maintenance therapy). You and your health care provider will work together to choose the treatment program that is best for you. Follow these instructions at home: Medicines  Take over-the-counter and prescription medicines only as told by your health care provider.  If you   are taking an anticoagulant medicine: ? Take the medicine every day at the same time each day. ? Understand what foods and drugs interact with your medicine. ? Understand the side effects of this medicine, including excessive bruising or bleeding. Ask your health care provider or pharmacist about other side effects. General  instructions  Wear a medical alert bracelet or carry a medical alert card that says you have had a PE and lists what medicines you take.  Ask your health care provider when you may return to your normal activities. Avoid sitting or lying for a long time without moving.  Maintain a healthy weight. Ask your health care provider what weight is healthy for you.  Do not use any products that contain nicotine or tobacco, such as cigarettes, e-cigarettes, and chewing tobacco. If you need help quitting, ask your health care provider.  Talk with your health care provider about any travel plans. It is important to make sure that you are still able to take your medicine while on trips.  Keep all follow-up visits as told by your health care provider. This is important. Contact a health care provider if:  You missed a dose of your blood thinner medicine. Get help right away if:  You have: ? New or increased pain, swelling, warmth, or redness in an arm or leg. ? Numbness or tingling in an arm or leg. ? Shortness of breath during activity or at rest. ? A fever. ? Chest pain. ? A rapid or irregular heartbeat. ? A severe headache. ? Vision changes. ? A serious fall or accident, or you hit your head. ? Stomach (abdominal) pain. ? Blood in your vomit, stool, or urine. ? A cut that will not stop bleeding.  You cough up blood.  You feel light-headed or dizzy.  You cannot move your arms or legs.  You are confused or have memory loss. These symptoms may represent a serious problem that is an emergency. Do not wait to see if the symptoms will go away. Get medical help right away. Call your local emergency services (911 in the [place] Do not drive yourself to the hospital. Summary  A pulmonary embolism (PE) is a sudden blockage or decrease of blood flow in one or both lungs. PE is a dangerous and life-threatening condition that needs to be treated right away.  Treatments for this condition usually  include medicines to thin your blood (anticoagulants) or medicines to break apart blood clots (thrombolytics).  If you are given blood thinners, it is important to take the medicine every day at the same time each day.  Understand what foods and drugs interact with any medicines that you are taking.  If you have signs of PE or DVT, call your local emergency services (911 in the [place] This information is not intended to replace advice given to you by your health care provider. Make sure you discuss any questions you have with your health care provider. Document Revised: 06/25/2018 Document Reviewed: 06/25/2018 Elsevier Patient Education  2020 [employer].    Information on my medicine - XARELTO (rivaroxaban)  This medication education was reviewed with me or my healthcare representative as part of my discharge preparation.  The pharmacist that spoke with me during my hospital stay was:  Charyl Dancer Lippucci, RPH  WHY WAS XARELTO PRESCRIBED FOR YOU? Xarelto was prescribed to treat blood clots that may have been found in your lungs (pulmonary embolism) and to reduce the risk of them occurring again.  What do  you need to know about Xarelto? The starting dose is one 15 mg tablet taken TWICE daily with food for the FIRST 21 DAYS then on February 11th, 2021  the dose is changed to one 20 mg tablet taken ONCE A DAY with your largest meal.  DO NOT stop taking Xarelto without talking to the health care provider who prescribed the medication.  Refill your prescription for 20 mg tablets before you run out.  After discharge, you should have regular check-up appointments with your healthcare provider that is prescribing your Xarelto.  In the future your dose may need to be changed if your kidney function changes by a significant amount.  What do you do if you miss a dose? If you are taking Xarelto TWICE DAILY and you miss a dose, take it as soon as you remember. You may take two 15 mg tablets  (total 30 mg) at the same time then resume your regularly scheduled 15 mg twice daily the next day.  If you are taking Xarelto ONCE DAILY and you miss a dose, take it as soon as you remember on the same day then continue your regularly scheduled once daily regimen the next day. Do not take two doses of Xarelto at the same time.   Important Safety Information Xarelto is a blood thinner medicine that can cause bleeding. You should call your healthcare provider right away if you experience any of the following: ? Bleeding from an injury or your nose that does not stop. ? Unusual colored urine (red or dark brown) or unusual colored stools (red or black). ? Unusual bruising for unknown reasons. ? A serious fall or if you hit your head (even if there is no bleeding).  Some medicines may interact with Xarelto and might increase your risk of bleeding while on Xarelto. To help avoid this, consult your healthcare provider or pharmacist prior to using any new prescription or non-prescription medications, including herbals, vitamins, non-steroidal anti-inflammatory drugs (NSAIDs) and supplements.  This website has more information on Xarelto: [email or web address]-[email or web address].[email or web address]

## 2019-10-22 NOTE — Telephone Encounter (Signed)
Pt got this from [facility] previously. Ok to fill?

## 2019-10-22 NOTE — Progress Notes (Signed)
SATURATION QUALIFICATIONS: (This note is used to comply with regulatory documentation for home oxygen)  Patient Saturations on Room Air at Rest = 97%  Patient Saturations on Room Air while Ambulating = 93% at lowest per Georgia Lopes, NT  Patient Saturations on NA Liters of oxygen while Ambulating = NA  Please briefly explain why patient needs home oxygen: No home O2 needs

## 2019-10-22 NOTE — Care Management (Signed)
10-22-19 1355 Case Manager received message from physician that patient will transition home on Xarelto. Case Manager asked if medication can be sent to the transition of care pharmacy to be delivered to the patient prior to transition home. Staff Registered Nurse will provide the patient with the discount card. No further needs from Case Manager. Gala Lewandowsky, RN,BSN Case Manager [phone]

## 2019-10-22 NOTE — Progress Notes (Signed)
Subjective:   [name] [name] is feeling well this morning. He denies any shortness of breath. No chest pain with deep breathing. Still endorses RLE pain described as aching. He has been able to ambulate around the room without getting short of breath.   No prior history of blood clots, easy bleeding or bruising. Discussed that we do not think COVID was a provoking event, and he will likely need a longer course of blood thinning medication, potentially indefinite.   Objective:  Vital signs in last 24 hours: Vitals:   10/22/19 0030 10/22/19 0100 10/22/19 0140 10/22/19 0534  BP: (!) 106/43 (!) 103/57 117/75 112/70  Pulse: (!) 57 (!) 54 (!) 59 (!) 55  Resp: 11 (!) 24 20 19   Temp:   98.1 F (36.7 C) 98.2 F (36.8 C)  TempSrc:   Oral Oral  SpO2: 94% 93% 98% 96%  Weight:   133.4 kg   Height:       Physical Exam Vitals and nursing note reviewed.  Constitutional:      General: He is not in acute distress.    Appearance: He is obese.  Neck:     Vascular: No JVD.  Cardiovascular:     Rate and Rhythm: Normal rate and regular rhythm.     Heart sounds: Normal heart sounds. Heart sounds not distant. No systolic murmur. No diastolic murmur.  Pulmonary:     Effort: Pulmonary effort is normal. No tachypnea or respiratory distress.     Breath sounds: Normal breath sounds. No decreased breath sounds, wheezing or rales.  Musculoskeletal:     Right lower leg: No tenderness. No edema.     Left lower leg: No tenderness. No edema.  Skin:    General: Skin is warm and dry.  Neurological:     General: No focal deficit present.     Mental Status: He is alert and oriented to person, place, and time.     Motor: No weakness.  Psychiatric:        Mood and Affect: Mood normal.        Behavior: Behavior normal.    Assessment/Plan:  Active Problems:   Pulmonary embolism (HCC)  # Pulmonary Embolism  CT angio obtained yesterday after patient had been experiencing chest pressure for 4-5 days in  light of recent right leg pain concerning for DVT. D-dimer was elevated as well. Concern for right heart strain was noted initially, however patient is hemodynamically stable. No evidence of JVD on examination. EKG does demonstrate RBBB with left axis deviation, however this is consistent with his prior EKGs in 2016. Likely unprovoked, as he denies hospitalizations, long car/plane rides, family history of clotting disorders, recent surgeries. He has recently had COVID, but less likely to have caused such an extended hypercoagulable state.   He tolerated Lovenox last night and was initially transitioned to Elliquis this AM. Given the benefits of one time per day dosing of Xarelto, I have reached out to pharmacy and [facility] regarding price check. Discussed with patient that since PE unprovoked, he will need at least 6 month treatment, if not longer.   Ambulating pulse oximetry is stable with lowest drop to 93%. He is stable for discharge at this time with close follow up.   - Follow up with PCP in the next 1 week - Discharge with Elliquis vs Xarelto, depending on price check  # AKI  Mild increase in creatinine from 1.13 to 1.26. He received 1 L of IVF.   # Hypertension  BP meds initially held due to slightly increased creatinine and concern for hypotension with PE. His BP overnight has been well controlled.   - Restart home med on discharge   # Atherosclerosis  # Hyperlipidemia  Calcification of the aortic arch and LAD noted on CT angio. Patient is already taking Atorvastatin. Recommend outpatient follow up with PCP.    Prior to Admission Living Arrangement:  Home  Anticipated Discharge Location: Home  Barriers to Discharge: Pricing DOAC Elliquis vs Xarelto  Dispo: Anticipated discharge in approximately 0-1 day(s).   Dr. Jose Persia Internal Medicine PGY-1  Pager: [phone] 10/22/2019, 10:19 AM

## 2019-10-22 NOTE — Discharge Summary (Signed)
Name: [name] [name] MRN: [id number] DOB: 31-Mar-1971 49 y.o. PCP: Terald Sleeper, PA-C  Date of Admission: 10/21/2019  5:34 PM Date of Discharge: 10/22/2019 Attending Physician: Dr. Evette Doffing  Discharge Diagnosis: 1. Pulmonary Embolisms, Unprovoked  Discharge Medications: Allergies as of 10/22/2019   No Known Allergies     Medication List    STOP taking these medications   levothyroxine 50 MCG tablet Commonly known as: SYNTHROID     TAKE these medications   amLODipine-benazepril 5-20 MG capsule Commonly known as: LOTREL TAKE 1 CAPSULE BY MOUTH DAILY. (PLEASE MAKE 6 MOS SEPT APPT) What changed: See the new instructions.   atorvastatin 40 MG tablet Commonly known as: LIPITOR TAKE 1 TABLET (40 MG TOTAL) BY MOUTH DAILY. What changed:   how much to take  how to take this  when to take this  additional instructions   Rivaroxaban 15 & 20 MG Tbpk Follow package directions: Take one 15mg  tablet by mouth twice a day. On day 22, switch to one 20mg  tablet once a day. Take with food.   Xiidra 5 % Soln Generic drug: Lifitegrast Place 1 drop into both eyes 2 (two) times daily.       Disposition and follow-up:   [name] [name] was discharged from [facility] in Good condition.  At the hospital follow up visit please address:  1.   Ensure patient is tolerating Xarelto well TSH was elevated with decreased T4, reassess with patient should restart levothyroxine Esophagitis and hepatic steatosis noted on CTA, please follow-up   2.  Labs / imaging needed at time of follow-up: CBC  3.  Pending labs/ test needing follow-up: Prothrombin gene mutation, factor V Leiden, lupus anticoagulant panel, protein S (total and activity), protein C activity  Follow-up Appointments: Follow-up Information    Terald Sleeper, PA-C. Schedule an appointment as soon as possible for a visit in 1 week(s).   Specialties: Physician Assistant, Family Medicine Contact  information: [place] [address] [place] [facility] Course by problem list: 1. Pulmonary Embolisms, Unprovoked Patient had been experiencing chest pressure for 4-5 days with recent right leg pain concerning for DVT.  D-dimer was elevated and CTA was obtained, that showed multiple embolisms noted mostly on the left lobes and minimal on the right.  Concern for right heart strain was noted initially, however patient remained hemodynamically stable without evidence of volume overload. EKG does demonstrate RBBB with left axis deviation, however this is consistent with his prior EKGs in 2016. PEs are considered unprovoked at this time, as he denies hospitalizations, long car/plane rides, family history of clotting disorders, recent surgeries. He has recently had COVID, but less likely to have caused such an extended hypercoagulable state.  He was started on Xarelto due to ease of dosing after initial 3-week loading.  He was provided with 1 month prescription on discharge.  Discharge Vitals:   BP 112/70 (BP Location: Left Arm)   Pulse (!) 55   Temp 98.2 F (36.8 C) (Oral)   Resp 19   Ht 5\' 8"  (1.727 m)   Wt 133.4 kg   SpO2 96%   BMI 44.72 kg/m   Pertinent Labs, Studies, and Procedures:   CBC Latest Ref Rng & Units 10/22/2019 10/21/2019 10/20/2019  WBC 4.0 - 10.5 K/uL 8.8 9.4 10.2  Hemoglobin 13.0 - 17.0 g/dL 13.6 14.9 15.2  Hematocrit 39.0 - 52.0 % 40.4 45.2 45.3  Platelets 150 -  400 K/uL 209 231 256   CMP Latest Ref Rng & Units 10/22/2019 10/21/2019 10/20/2019  Glucose 70 - 99 mg/dL 409(W) 119(J) 85  BUN 6 - 20 mg/dL 14 15 13   Creatinine 0.61 - 1.24 mg/dL ) 4.78(G) 9.56(O  Sodium 135 - 145 mmol/L 141 138 140  Potassium 3.5 - 5.1 mmol/L 4.4 3.8 4.6  Chloride 98 - 111 mmol/L 107 106 103  CO2 22 - 32 mmol/L 24 20(L) 20  Calcium 8.9 - 10.3 mg/dL 8.9 9.2 1.30  Total Protein 6.5 - 8.1 g/dL 6.2(L) 6.9 7.4  Total Bilirubin 0.3 - 1.2 mg/dL 0.7 0.6 0.4  Alkaline  Phos 38 - 126 U/L 48 55 71  AST 15 - 41 U/L 33 38 25  ALT 0 - 44 U/L 50(H) 55(H) 37   D-dimer: 1.40 Antithrombin: 102 Protein C, total: 105 Beta-2 glycoprotein IgG: Negative Homocystine: 9.4 Cardiolipin antibodies: Negative  BNP: 15.2  TSH: 9.830 T4, total: 3.9 T3 uptake ratio: 25 Free thyroxine index: 1.0  CTA Chest: 10/21/2019 IMPRESSION: 1. Right upper lobe lobar bilateral segmental acute pulmonary embolus, with right ventricular to left ventricular ratio 1.2. Positive for acute PE with CT evidence of right heart strain (RV/LV Ratio = 1.2) consistent with at least submassive (intermediate risk) PE. The presence of right heart strain has been associated with an increased risk of morbidity and mortality. Please activate Code PE by paging [phone]. 2. Mild cardiomegaly. 3. Mild distal esophageal wall thickening, the most common cause of be esophagitis. Adjacent small paraesophageal lymph nodes. 4. Suspected diffuse hepatic steatosis.  Discharge Instructions: Discharge Instructions    Call MD for:   Complete by: As directed    Chest pain Difficulty breathing  Increased shortness of breathe   Call MD for:  difficulty breathing, headache or visual disturbances   Complete by: As directed    Call MD for:  extreme fatigue   Complete by: As directed    Call MD for:  persistant dizziness or light-headedness   Complete by: As directed    Diet - low sodium heart healthy   Complete by: As directed    Discharge instructions   Complete by: As directed    [name] [name] were admitted to the hospital for blood clots in your lungs that were seen on imaging. While here, you were started on blood thinning medication that will reduce your chances of having clots again in the future. Since these clots were unprovoked, treatment duration minimum is 6 months. If you experience any excessive bleeding, please contact your doctor ASAP.   Increase activity slowly   Complete by: As  directed       Signed: Dr. Alvis Lemmings Internal Medicine PGY-1  Pager: [phone] 10/24/2019, 5:32 PM

## 2019-10-22 NOTE — Care Management (Signed)
Per Johny Shears W/[employer] Co-pay amount for Eliquis 10 mg for 30 day supply $30.00  No PA required  Tier 2 Deductible Retail pharmacy [facility].  Mail order pharmacy 90 day supply $60.00.  Ref# [id number]

## 2019-10-23 ENCOUNTER — Other Ambulatory Visit: Payer: Self-pay | Admitting: Family Medicine

## 2019-10-23 ENCOUNTER — Telehealth: Payer: Self-pay | Admitting: *Deleted

## 2019-10-23 DIAGNOSIS — E039 Hypothyroidism, unspecified: Secondary | ICD-10-CM

## 2019-10-23 LAB — CARDIOLIPIN ANTIBODIES, IGG, IGM, IGA
Anticardiolipin IgA: <9 APL U/mL (ref 0–11)
Anticardiolipin IgG: <9 GPL U/mL (ref 0–14)
Anticardiolipin IgM: <9 MPL U/mL (ref 0–12)

## 2019-10-23 LAB — BETA-2-GLYCOPROTEIN I ABS, IGG/M/A
Beta-2 Glyco I IgG: <9 GPI IgG units (ref 0–20)
Beta-2-Glycoprotein I IgA: <9 GPI IgA units (ref 0–25)
Beta-2-Glycoprotein I IgM: <9 GPI IgM units (ref 0–32)

## 2019-10-23 LAB — PROTEIN C, TOTAL: Protein C, Total: 105 % (ref 60–150)

## 2019-10-23 MED ORDER — LEVOTHYROXINE SODIUM 75 MCG PO TABS: 75.0000 ug | ORAL_TABLET | Freq: Every day | ORAL | 2 refills | Status: DC

## 2019-10-23 NOTE — Telephone Encounter (Signed)
I am starting patient on Synthroid daily given (this is about half of weight based dose). May need to titrate further at follow up appointment in 6-8 weeks.  Recommend repeat labs at that time.  Nurse, please review with patient how to properly take synthroid (empty stomach with water only, separated by other medications by at least 20mins-1 hour).  NO biotin products (commonly found in hair skin and nail vitamins).

## 2019-10-23 NOTE — Telephone Encounter (Signed)
Patient aware.

## 2019-10-23 NOTE — Telephone Encounter (Signed)
Patient is not currently on any thyroid medication at this time

## 2019-10-26 LAB — PROTHROMBIN GENE MUTATION

## 2019-10-26 LAB — PROTEIN C ACTIVITY: Protein C Activity: 121 % (ref 73–180)

## 2019-10-26 LAB — LUPUS ANTICOAGULANT PANEL
DRVVT: 41 s (ref 0.0–47.0)
PTT Lupus Anticoagulant: 27.2 s (ref 0.0–51.9)

## 2019-10-26 LAB — FACTOR 5 LEIDEN

## 2019-10-26 LAB — PROTEIN S ACTIVITY: Protein S Activity: 129 % (ref 63–140)

## 2019-10-26 LAB — PROTEIN S, TOTAL: Protein S Ag, Total: 128 % (ref 60–150)

## 2019-11-13 ENCOUNTER — Other Ambulatory Visit: Payer: Self-pay

## 2019-11-14 ENCOUNTER — Other Ambulatory Visit: Payer: Self-pay | Admitting: Physician Assistant

## 2019-11-16 ENCOUNTER — Ambulatory Visit: Payer: BC Managed Care – PPO | Admitting: Family Medicine

## 2019-11-18 ENCOUNTER — Ambulatory Visit (INDEPENDENT_AMBULATORY_CARE_PROVIDER_SITE_OTHER): Payer: BC Managed Care – PPO | Admitting: Family Medicine

## 2019-11-18 ENCOUNTER — Encounter: Payer: Self-pay | Admitting: Family Medicine

## 2019-11-18 ENCOUNTER — Other Ambulatory Visit: Payer: Self-pay

## 2019-11-18 VITALS — BP 123/82 | HR 84 | Temp 98.4°F | Ht 68.0 in | Wt 301.0 lb

## 2019-11-18 DIAGNOSIS — R7989 Other specified abnormal findings of blood chemistry: Secondary | ICD-10-CM

## 2019-11-18 DIAGNOSIS — E034 Atrophy of thyroid (acquired): Secondary | ICD-10-CM

## 2019-11-18 DIAGNOSIS — Z7901 Long term (current) use of anticoagulants: Secondary | ICD-10-CM | POA: Diagnosis not present

## 2019-11-18 DIAGNOSIS — I2699 Other pulmonary embolism without acute cor pulmonale: Secondary | ICD-10-CM | POA: Diagnosis not present

## 2019-11-18 DIAGNOSIS — Z09 Encounter for follow-up examination after completed treatment for conditions other than malignant neoplasm: Secondary | ICD-10-CM | POA: Diagnosis not present

## 2019-11-18 MED ORDER — RIVAROXABAN 20 MG PO TABS: 20.0000 mg | ORAL_TABLET | Freq: Every day | ORAL | 4 refills | Status: DC

## 2019-11-18 NOTE — Progress Notes (Signed)
Subjective: CC: hospital follow up PCP: Terald Sleeper, PA-C IRJ:[id number] [name] [name] is a 49 y.o. male presenting to clinic today for:  1. Hospital follow up  Acute PE Patient was admitted to the hospital on 10/21/2019 after his CTA demonstrated a multifocal subsegmental pulmonary embolism.  He was initially treated with Lovenox and then ultimately transitioned over to Xarelto starter pack, with plans to transition over to Xarelto daily for a total of 77mgiven unprovoked PE.  Of note, his mother has history of pulmonary embolism and DVT.  This was thought to be secondary to malignancy.  Patient did have hypercoagulability work-up in the hospital which was unrevealing.  He did however have COVID-19 about 2 months prior to event.  Venous ultrasound of the lower extremities was unrevealing.  No DVT was noted in the right lower extremity.  He continues to have some dyspnea with exertion but he notes that this is when walking a far distance of a significant incline.  He otherwise is tolerating activity without difficulty.  No hemoptysis, chest pain, lower extremity edema.  Right calf and leg swelling has resolved since being on anticoagulation.  He denies any hematochezia, melena, easy bleeding including epistaxis.  He is compliant with Xarelto and is now taking 20 mg daily.  He has about 2 days left.  Hypothyroidism Initial work-up was for fatigue.  TSH was noted to be 9.83 on 19 January.  Patient was started back on Synthroid at 75 mcg daily.  He denies any heart palpitations, difficulty swallowing, change in voice, tremor or change in bowel habits.  He has been compliant with medication.  ROS: Per HPI  No Known Allergies Past Medical History:  Diagnosis Date  . Hyperlipidemia   . Hypertension     Current Outpatient Medications:  .  amLODipine-benazepril (LOTREL) 5-20 MG capsule, Take 1 capsule by mouth daily., Disp: 30 capsule, Rfl: 0 .  atorvastatin (LIPITOR) 40 MG tablet, TAKE 1 TABLET (40  MG TOTAL) BY MOUTH DAILY. (Patient taking differently: Take 40 mg by mouth daily. ), Disp: 90 tablet, Rfl: 1 .  levothyroxine (SYNTHROID) 75 MCG tablet, Take 1 tablet (75 mcg total) by mouth daily. Needs thyroid labs in 6-8 weeks, Disp: 30 tablet, Rfl: 2 .  Rivaroxaban 15 & 20 MG TBPK, Follow package directions: Take one 123mtablet by mouth twice a day. On day 22, switch to one 2058mablet once a day. Take with food., Disp: 51 each, Rfl: 0 .  XIIDRA 5 % SOLN, Place 1 drop into both eyes 2 (two) times daily. , Disp: , Rfl:  Social History   Socioeconomic History  . Marital status: Married    Spouse name: Not on file  . Number of children: Not on file  . Years of education: Not on file  . Highest education level: Not on file  Occupational History  . Not on file  Tobacco Use  . Smoking status: Never Smoker  . Smokeless tobacco: Never Used  Substance and Sexual Activity  . Alcohol use: No  . Drug use: No  . Sexual activity: Not on file  Other Topics Concern  . Not on file  Social History Narrative  . Not on file   Social Determinants of Health   Financial Resource Strain:   . Difficulty of Paying Living Expenses: Not on file  Food Insecurity:   . Worried About RunCharity fundraiser the Last Year: Not on file  . Ran Out of Food in the Last  Year: Not on file  Transportation Needs:   . Lack of Transportation (Medical): Not on file  . Lack of Transportation (Non-Medical): Not on file  Physical Activity:   . Days of Exercise per Week: Not on file  . Minutes of Exercise per Session: Not on file  Stress:   . Feeling of Stress : Not on file  Social Connections:   . Frequency of Communication with Friends and Family: Not on file  . Frequency of Social Gatherings with Friends and Family: Not on file  . Attends Religious Services: Not on file  . Active Member of Clubs or Organizations: Not on file  . Attends Archivist Meetings: Not on file  . Marital Status: Not on file   Intimate Partner Violence:   . Fear of Current or Ex-Partner: Not on file  . Emotionally Abused: Not on file  . Physically Abused: Not on file  . Sexually Abused: Not on file   Family History  Problem Relation Age of Onset  . Cancer Mother        breast cancer  . Heart attack Father     Objective: Office vital signs reviewed. BP 123/82   Pulse 84   Temp 98.4 F (36.9 C) (Temporal)   Ht '5\' 8"'  (1.727 m)   Wt (!) 301 lb (136.5 kg)   SpO2 97%   BMI 45.77 kg/m   Physical Examination:  General: Awake, alert, morbidly obese, No acute distress HEENT: Normal    Neck: No masses palpated. No lymphadenopathy; no goiter    Eyes: PERRLA, extraocular membranes intact, sclera white.  No exophthalmos Cardio: regular rate and rhythm, S1S2 heard, no murmurs appreciated Pulm: clear to auscultation bilaterally, no wheezes, rhonchi or rales; normal work of breathing on room air Extremities: warm, well perfused, No edema, cyanosis or clubbing; +2 pulses bilaterally MSK: Normal gait and station  Right lower extremity: No swelling.  No tenderness palpation to the posterior calf.  No palpable cords. Skin: dry; intact; no rashes or lesions; warm Neuro: No tremor  Assessment/ Plan: 49 y.o. male   1. Hospital discharge follow-up I reviewed his discharge summary and recommendations.  Hypercoagulability panel was negative.  I suspect that the embolus was related to recent Covid infection, body habitus and sedentary lifestyle.  We discussed that no restrictions but his breathing should be his indicator if he is exerting himself too much.  Would encourage low resistance physical activity as he is interested in pursuing weight loss  2. Acute pulmonary embolism without acute cor pulmonale, unspecified pulmonary embolism type (HCC) Currently being treated with Xarelto.  We discussed need for total of 6 months of treatment.  Several samples were given him today.  He is to start the prescription that has  been sent to the pharmacy once he has completed them.  Will reassess in about 2 months - CBC - rivaroxaban (XARELTO) 20 MG TABS tablet; Take 1 tablet (20 mg total) by mouth daily with supper.  Dispense: 30 tablet; Refill: 4  3. Elevated liver function tests Possibly related to acute illness - CMP14+EGFR  4. Hypothyroidism due to acquired atrophy of thyroid Started back on 75 mcg of Synthroid about 3-1/2 weeks ago.  No adverse side effects or evidence of hyper or hypothyroidism at this time.  We will check antibodies to see if he processed autoimmune component to the thyroid disorder. - Thyroid Panel With TSH - Thyroid Peroxidase Antibody - Thyroglobulin antibody  5. Anticoagulated No red flags or bleeding. -  CBC   No orders of the defined types were placed in this encounter.  No orders of the defined types were placed in this encounter.    Janora Norlander, DO [place] [phone]

## 2019-11-18 NOTE — Patient Instructions (Addendum)
As we discussed, your clotting panel was NEGATIVE for genetic reasons for clotting.  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  We will continue once daily Xarelto through June.  Then should be able to stop it.  You were given samples.  The prescription also sent to pharmacy.  I will call you with thyroid results and we will adjust prescription if needed.  You had labs performed today.  You will be contacted with the results of the labs once they are available, usually in the next 3 business days for routine lab work.  If you have an active my chart account, they will be released to your MyChart.  If you prefer to have these labs released to you via telephone, please let us know.  If you had a pap smear or biopsy performed, expect to be contacted in about 7-10 days.   Pulmonary Embolism  A pulmonary embolism (PE) is a sudden blockage or decrease of blood flow in one or both lungs. Most blockages come from a blood clot that forms in the vein of a lower leg, thigh, or arm (deep vein thrombosis, DVT) and travels to the lungs. A clot is blood that has thickened into a gel or solid. PE is a dangerous and life-threatening condition that needs to be treated right away. What are the causes? This condition is usually caused by a blood clot that forms in a vein and moves to the lungs. In rare cases, it may be caused by air, fat, part of a tumor, or other tissue that moves through the veins and into the lungs. What increases the risk? The following factors may make you more likely to develop this condition:  Experiencing a traumatic injury, such as breaking a hip or leg.  Having: ? A spinal cord injury. ? Orthopedic surgery, especially hip or knee replacement. ? Any major surgery. ? A stroke. ? DVT. ? Blood clots or blood clotting disease. ? Long-term (chronic) lung or heart disease. ? Cancer treated with chemotherapy. ? A  central venous catheter.  Taking medicines that contain estrogen. These include birth control pills and hormone replacement therapy.  Being: ? Pregnant. ? In the period of time after your baby is delivered (postpartum). ? Older than age 50. ? Overweight. ? A smoker, especially if you have other risks. What are the signs or symptoms? Symptoms of this condition usually start suddenly and include:  Shortness of breath during activity or at rest.  Coughing, coughing up blood, or coughing up blood-tinged mucus.  Chest pain that is often worse with deep breaths.  Rapid or irregular heartbeat.  Feeling light-headed or dizzy.  Fainting.  Feeling anxious.  Fever.  Sweating.  Pain and swelling in a leg. This is a symptom of DVT, which can lead to PE. How is this diagnosed? This condition may be diagnosed based on:  Your medical history.  A physical exam.  Blood tests.  CT pulmonary angiogram. This test checks blood flow in and around your lungs.  Ventilation-perfusion scan, also called a lung VQ scan. This test measures air flow and blood flow to the lungs.  An ultrasound of the legs. How is this treated? Treatment for this condition depends on many factors, such as the cause of your PE, your risk for bleeding or developing more clots, and other medical conditions you have. Treatment aims to remove, dissolve, or stop blood clots from forming or growing  larger. Treatment may include:  Medicines, such as: ? Blood thinning medicines (anticoagulants) to stop clots from forming. ? Medicines that dissolve clots (thrombolytics).  Procedures, such as: ? Using a flexible tube to remove a blood clot (embolectomy) or to deliver medicine to destroy it (catheter-directed thrombolysis). ? Inserting a filter into a large vein that carries blood to the heart (inferior vena cava). This filter (vena cava filter) catches blood clots before they reach the lungs. ? Surgery to remove the clot  (surgical embolectomy). This is rare. You may need a combination of immediate, long-term (up to 3 months after diagnosis), and extended (more than 3 months after diagnosis) treatments. Your treatment may continue for several months (maintenance therapy). You and your health care provider will work together to choose the treatment program that is best for you. Follow these instructions at home: Medicines  Take over-the-counter and prescription medicines only as told by your health care provider.  If you are taking an anticoagulant medicine: ? Take the medicine every day at the same time each day. ? Understand what foods and drugs interact with your medicine. ? Understand the side effects of this medicine, including excessive bruising or bleeding. Ask your health care provider or pharmacist about other side effects. General instructions  Wear a medical alert bracelet or carry a medical alert card that says you have had a PE and lists what medicines you take.  Ask your health care provider when you may return to your normal activities. Avoid sitting or lying for a long time without moving.  Maintain a healthy weight. Ask your health care provider what weight is healthy for you.  Do not use any products that contain nicotine or tobacco, such as cigarettes, e-cigarettes, and chewing tobacco. If you need help quitting, ask your health care provider.  Talk with your health care provider about any travel plans. It is important to make sure that you are still able to take your medicine while on trips.  Keep all follow-up visits as told by your health care provider. This is important. Contact a health care provider if:  You missed a dose of your blood thinner medicine. Get help right away if:  You have: ? New or increased pain, swelling, warmth, or redness in an arm or leg. ? Numbness or tingling in an arm or leg. ? Shortness of breath during activity or at rest. ? A fever. ? Chest pain. ? A  rapid or irregular heartbeat. ? A severe headache. ? Vision changes. ? A serious fall or accident, or you hit your head. ? Stomach (abdominal) pain. ? Blood in your vomit, stool, or urine. ? A cut that will not stop bleeding.  You cough up blood.  You feel light-headed or dizzy.  You cannot move your arms or legs.  You are confused or have memory loss. These symptoms may represent a serious problem that is an emergency. Do not wait to see if the symptoms will go away. Get medical help right away. Call your local emergency services (911 in the [place] Do not drive yourself to the hospital. Summary  A pulmonary embolism (PE) is a sudden blockage or decrease of blood flow in one or both lungs. PE is a dangerous and life-threatening condition that needs to be treated right away.  Treatments for this condition usually include medicines to thin your blood (anticoagulants) or medicines to break apart blood clots (thrombolytics).  If you are given blood thinners, it is important to take the medicine  every day at the same time each day.  Understand what foods and drugs interact with any medicines that you are taking.  If you have signs of PE or DVT, call your local emergency services (911 in the [place] This information is not intended to replace advice given to you by your health care provider. Make sure you discuss any questions you have with your health care provider. Document Revised: 06/25/2018 Document Reviewed: 06/25/2018 Elsevier Patient Education  2020 [employer].

## 2019-11-19 ENCOUNTER — Ambulatory Visit: Payer: BC Managed Care – PPO | Admitting: Family Medicine

## 2019-11-19 LAB — CMP14+EGFR
ALT: 31 IU/L (ref 0–44)
AST: 16 IU/L (ref 0–40)
Albumin/Globulin Ratio: 1.5 (ref 1.2–2.2)
Albumin: 4.3 g/dL (ref 4.0–5.0)
Alkaline Phosphatase: 72 IU/L (ref 39–117)
BUN/Creatinine Ratio: 12 (ref 9–20)
BUN: 14 mg/dL (ref 6–24)
Bilirubin Total: 0.2 mg/dL (ref 0.0–1.2)
CO2: 22 mmol/L (ref 20–29)
Calcium: 9.5 mg/dL (ref 8.7–10.2)
Chloride: 105 mmol/L (ref 96–106)
Creatinine, Ser: 1.15 mg/dL (ref 0.76–1.27)
GFR calc Af Amer: 86 mL/min/{1.73_m2} (ref >59)
GFR calc non Af Amer: 74 mL/min/{1.73_m2} (ref >59)
Globulin, Total: 2.8 g/dL (ref 1.5–4.5)
Glucose: 114 mg/dL — ABNORMAL HIGH (ref 65–99)
Potassium: 3.8 mmol/L (ref 3.5–5.2)
Sodium: 140 mmol/L (ref 134–144)
Total Protein: 7.1 g/dL (ref 6.0–8.5)

## 2019-11-19 LAB — CBC
Hematocrit: 43.8 % (ref 37.5–51.0)
Hemoglobin: 14.7 g/dL (ref 13.0–17.7)
MCH: 28.9 pg (ref 26.6–33.0)
MCHC: 33.6 g/dL (ref 31.5–35.7)
MCV: 86 fL (ref 79–97)
Platelets: 254 10*3/uL (ref 150–450)
RBC: 5.09 x10E6/uL (ref 4.14–5.80)
RDW: 14.2 % (ref 11.6–15.4)
WBC: 9.5 10*3/uL (ref 3.4–10.8)

## 2019-11-19 LAB — THYROID PANEL WITH TSH
Free Thyroxine Index: 1.4 (ref 1.2–4.9)
T3 Uptake Ratio: 29 % (ref 24–39)
T4, Total: 4.8 ug/dL (ref 4.5–12.0)
TSH: 2.9 u[IU]/mL (ref 0.450–4.500)

## 2019-11-19 LAB — THYROID PEROXIDASE ANTIBODY: Thyroperoxidase Ab SerPl-aCnc: 77 IU/mL — ABNORMAL HIGH (ref 0–34)

## 2019-11-19 LAB — THYROGLOBULIN ANTIBODY: Thyroglobulin Antibody: <1 IU/mL (ref 0.0–0.9)

## 2019-11-23 ENCOUNTER — Telehealth: Payer: Self-pay | Admitting: *Deleted

## 2019-11-23 NOTE — Telephone Encounter (Signed)
Patient aware of results.

## 2019-12-15 ENCOUNTER — Other Ambulatory Visit: Payer: Self-pay | Admitting: Family Medicine

## 2019-12-15 ENCOUNTER — Other Ambulatory Visit: Payer: Self-pay | Admitting: Physician Assistant

## 2019-12-15 DIAGNOSIS — E039 Hypothyroidism, unspecified: Secondary | ICD-10-CM

## 2020-01-11 ENCOUNTER — Other Ambulatory Visit: Payer: Self-pay | Admitting: *Deleted

## 2020-01-11 MED ORDER — AMLODIPINE BESY-BENAZEPRIL HCL 5-20 MG PO CAPS: ORAL_CAPSULE | ORAL | 0 refills | Status: DC

## 2020-02-12 ENCOUNTER — Other Ambulatory Visit: Payer: Self-pay | Admitting: Family Medicine

## 2020-02-12 NOTE — Telephone Encounter (Signed)
Former Yetta [place]re. NTBS 30 days given 01/11/20

## 2020-02-15 NOTE — Telephone Encounter (Signed)
lmtcb to schedule f/u appt 

## 2020-02-18 ENCOUNTER — Other Ambulatory Visit: Payer: Self-pay | Admitting: Family Medicine

## 2020-02-18 DIAGNOSIS — I2699 Other pulmonary embolism without acute cor pulmonale: Secondary | ICD-10-CM

## 2020-03-03 ENCOUNTER — Other Ambulatory Visit: Payer: Self-pay | Admitting: Family Medicine

## 2020-03-03 ENCOUNTER — Other Ambulatory Visit: Payer: Self-pay | Admitting: *Deleted

## 2020-03-03 DIAGNOSIS — I2699 Other pulmonary embolism without acute cor pulmonale: Secondary | ICD-10-CM

## 2020-03-03 MED ORDER — RIVAROXABAN 20 MG PO TABS: 20.0000 mg | ORAL_TABLET | Freq: Every day | ORAL | 3 refills | Status: DC

## 2020-03-03 NOTE — Telephone Encounter (Signed)
Former [place]. NTBS 30 days given 02/18/20 

## 2020-03-23 ENCOUNTER — Ambulatory Visit (INDEPENDENT_AMBULATORY_CARE_PROVIDER_SITE_OTHER): Payer: Self-pay | Admitting: Family Medicine

## 2020-03-23 ENCOUNTER — Encounter: Payer: Self-pay | Admitting: Family Medicine

## 2020-03-23 ENCOUNTER — Other Ambulatory Visit: Payer: Self-pay

## 2020-03-23 VITALS — BP 115/79 | HR 66 | Temp 97.5°F | Resp 20 | Ht 68.0 in | Wt 262.0 lb

## 2020-03-23 DIAGNOSIS — Z0289 Encounter for other administrative examinations: Secondary | ICD-10-CM

## 2020-03-23 DIAGNOSIS — Z024 Encounter for examination for driving license: Secondary | ICD-10-CM

## 2020-03-23 LAB — URINALYSIS
Bilirubin, UA: NEGATIVE
Glucose, UA: NEGATIVE
Ketones, UA: NEGATIVE
Leukocytes,UA: NEGATIVE
Nitrite, UA: NEGATIVE
Protein,UA: NEGATIVE
Specific Gravity, UA: 1.02 (ref 1.005–1.030)
Urobilinogen, Ur: 0.2 mg/dL (ref 0.2–1.0)
pH, UA: 5.5 (ref 5.0–7.5)

## 2020-03-23 NOTE — Progress Notes (Signed)
DOT exam performed. PE form scanned. I yr card given.

## 2020-05-02 DIAGNOSIS — Z9229 Personal history of other drug therapy: Secondary | ICD-10-CM | POA: Diagnosis not present

## 2020-05-02 DIAGNOSIS — R05 Cough: Secondary | ICD-10-CM | POA: Diagnosis not present

## 2020-05-02 DIAGNOSIS — R0981 Nasal congestion: Secondary | ICD-10-CM | POA: Diagnosis not present

## 2020-05-02 DIAGNOSIS — Z20822 Contact with and (suspected) exposure to covid-19: Secondary | ICD-10-CM | POA: Diagnosis not present

## 2020-05-05 DIAGNOSIS — Z9229 Personal history of other drug therapy: Secondary | ICD-10-CM | POA: Insufficient documentation

## 2020-05-05 HISTORY — DX: Personal history of other drug therapy: Z92.29

## 2020-07-26 ENCOUNTER — Other Ambulatory Visit: Payer: Self-pay | Admitting: Family Medicine

## 2020-07-26 DIAGNOSIS — I10 Essential (primary) hypertension: Secondary | ICD-10-CM

## 2020-07-26 DIAGNOSIS — I2699 Other pulmonary embolism without acute cor pulmonale: Secondary | ICD-10-CM

## 2020-07-26 DIAGNOSIS — E034 Atrophy of thyroid (acquired): Secondary | ICD-10-CM

## 2020-07-26 MED ORDER — AMLODIPINE BESY-BENAZEPRIL HCL 5-20 MG PO CAPS: ORAL_CAPSULE | ORAL | 0 refills | Status: DC

## 2020-07-27 ENCOUNTER — Other Ambulatory Visit: Payer: Self-pay | Admitting: Family Medicine

## 2020-07-27 DIAGNOSIS — E785 Hyperlipidemia, unspecified: Secondary | ICD-10-CM

## 2020-07-27 MED ORDER — ATORVASTATIN CALCIUM 40 MG PO TABS: ORAL_TABLET | ORAL | 0 refills | Status: DC

## 2020-08-08 ENCOUNTER — Ambulatory Visit: Payer: BC Managed Care – PPO | Admitting: Family Medicine

## 2020-08-08 ENCOUNTER — Ambulatory Visit (INDEPENDENT_AMBULATORY_CARE_PROVIDER_SITE_OTHER): Payer: BC Managed Care – PPO

## 2020-08-08 ENCOUNTER — Other Ambulatory Visit: Payer: Self-pay

## 2020-08-08 ENCOUNTER — Encounter: Payer: Self-pay | Admitting: Family Medicine

## 2020-08-08 VITALS — BP 113/76 | HR 60 | Temp 97.3°F | Ht 68.0 in | Wt 264.2 lb

## 2020-08-08 DIAGNOSIS — I10 Essential (primary) hypertension: Secondary | ICD-10-CM

## 2020-08-08 DIAGNOSIS — E034 Atrophy of thyroid (acquired): Secondary | ICD-10-CM

## 2020-08-08 DIAGNOSIS — M25561 Pain in right knee: Secondary | ICD-10-CM

## 2020-08-08 DIAGNOSIS — S83411A Sprain of medial collateral ligament of right knee, initial encounter: Secondary | ICD-10-CM | POA: Diagnosis not present

## 2020-08-08 DIAGNOSIS — I2699 Other pulmonary embolism without acute cor pulmonale: Secondary | ICD-10-CM | POA: Diagnosis not present

## 2020-08-08 DIAGNOSIS — S8991XA Unspecified injury of right lower leg, initial encounter: Secondary | ICD-10-CM

## 2020-08-08 MED ORDER — BETAMETHASONE SOD PHOS & ACET 6 (3-3) MG/ML IJ SUSP
12.0000 mg | Freq: Once | INTRAMUSCULAR | Status: AC
  Administered 2020-08-08: 6 mg via INTRAMUSCULAR

## 2020-08-08 MED ORDER — TRAMADOL HCL 50 MG PO TABS
50.0000 mg | ORAL_TABLET | Freq: Four times a day (QID) | ORAL | 0 refills | Status: AC
Start: 2020-08-08 — End: 2020-08-13

## 2020-08-08 NOTE — Progress Notes (Signed)
Chief Complaint  Patient presents with  . Knee Pain    Right  . Injury to right knee    HPI  Patient presents today for pain in the right knee.  [name] [name] came in today after a fall yesterday.  He was sitting on a barstool height stool yesterday and forward.  The stool turned over and he fell off.  He reports putting valgus stress and inversion on the knee.  He relates that when he puts any pressure on the leg, he is having 6-7/10 knee pain.  PMH: Smoking status noted ROS: Per HPI  Objective: BP 113/76   Pulse 60   Temp (!) 97.3 F (36.3 C) (Temporal)   Ht 5\' 8"  (1.727 m)   Wt 264 lb 3.2 oz (119.8 kg)   BMI 40.17 kg/m  Gen: NAD, alert, cooperative with exam Ext: No edema, warm the right  knee has full range of motion  actively and passively. Gait is normal with mild limp. The joint lines are tender medially, but not laterally. The patella is palpable without tenderness or edema.  Lachman / anterior drawer signs are negative for signs of instability and pain free. McMurray testing reveals no pop or excessive discomfort. Varusstress maneuvers causes medial collateral pain with minimal  ligamentous stretch. No instability. Neuro: Alert and oriented, No gross deficits  Assessment and plan:  1. Grade 1 injury of medial collateral ligament of right knee, initial encounter   2. Acute pain of right knee     Meds ordered this encounter  Medications  . betamethasone acetate-betamethasone sodium phosphate (CELESTONE) injection 12 mg  . traMADol (ULTRAM) 50 MG tablet    Sig: Take 1 tablet (50 mg total) by mouth 4 (four) times daily for 5 days. 1-2 tablets up to 4 times a day as needed for pain    Dispense:  20 tablet    Refill:  0    Orders Placed This Encounter  Procedures  . DG Knee 1-2 Views Right    Standing Status:   Future    Number of Occurrences:   1    Standing Expiration Date:   08/08/2021    Order Specific Question:   Reason for Exam (SYMPTOM  OR DIAGNOSIS REQUIRED)     Answer:   Injury to right knee,  Right knee pain    Order Specific Question:   Preferred imaging location?    Answer:   Internal   Hinged knee brace fitted.  I had like to see him wear it anytime he is on his feet for the next couple of weeks.  After that he can start weaning and letting pain be his guide.  Should the pain not been significant interim we will go ahead with orthopedic referral.  For now that conservative treatment offered here is more than adequate.  However he will keep me posted if there is a change for the worse or lack of improvement. Follow up as needed.  13/05/2021, MD

## 2020-08-09 LAB — CBC
Hematocrit: 45 % (ref 37.5–51.0)
Hemoglobin: 15 g/dL (ref 13.0–17.7)
MCH: 28.2 pg (ref 26.6–33.0)
MCHC: 33.3 g/dL (ref 31.5–35.7)
MCV: 85 fL (ref 79–97)
Platelets: 236 10*3/uL (ref 150–450)
RBC: 5.31 x10E6/uL (ref 4.14–5.80)
RDW: 14 % (ref 11.6–15.4)
WBC: 7.1 10*3/uL (ref 3.4–10.8)

## 2020-08-09 LAB — CMP14+EGFR
ALT: 21 IU/L (ref 0–44)
AST: 19 IU/L (ref 0–40)
Albumin/Globulin Ratio: 1.7 (ref 1.2–2.2)
Albumin: 4.6 g/dL (ref 4.0–5.0)
Alkaline Phosphatase: 68 IU/L (ref 44–121)
BUN/Creatinine Ratio: 10 (ref 9–20)
BUN: 13 mg/dL (ref 6–24)
Bilirubin Total: 0.4 mg/dL (ref 0.0–1.2)
CO2: 20 mmol/L (ref 20–29)
Calcium: 9.7 mg/dL (ref 8.7–10.2)
Chloride: 104 mmol/L (ref 96–106)
Creatinine, Ser: 1.27 mg/dL (ref 0.76–1.27)
GFR calc Af Amer: 76 mL/min/{1.73_m2} (ref >59)
GFR calc non Af Amer: 65 mL/min/{1.73_m2} (ref >59)
Globulin, Total: 2.7 g/dL (ref 1.5–4.5)
Glucose: 90 mg/dL (ref 65–99)
Potassium: 4.7 mmol/L (ref 3.5–5.2)
Sodium: 142 mmol/L (ref 134–144)
Total Protein: 7.3 g/dL (ref 6.0–8.5)

## 2020-08-09 LAB — LIPID PANEL
Chol/HDL Ratio: 3.8 ratio (ref 0.0–5.0)
Cholesterol, Total: 140 mg/dL (ref 100–199)
HDL: 37 mg/dL — ABNORMAL LOW (ref >39)
LDL Chol Calc (NIH): 85 mg/dL (ref 0–99)
Triglycerides: 98 mg/dL (ref 0–149)
VLDL Cholesterol Cal: 18 mg/dL (ref 5–40)

## 2020-08-09 LAB — THYROID PANEL WITH TSH
Free Thyroxine Index: 1.1 — ABNORMAL LOW (ref 1.2–4.9)
T3 Uptake Ratio: 29 % (ref 24–39)
T4, Total: 3.7 ug/dL — ABNORMAL LOW (ref 4.5–12.0)
TSH: 5.06 u[IU]/mL — ABNORMAL HIGH (ref 0.450–4.500)

## 2020-08-10 ENCOUNTER — Other Ambulatory Visit: Payer: Self-pay

## 2020-08-10 MED ORDER — LEVOTHYROXINE SODIUM 88 MCG PO TABS: 88.0000 ug | ORAL_TABLET | Freq: Every day | ORAL | 1 refills | Status: DC

## 2020-08-16 ENCOUNTER — Ambulatory Visit: Payer: BC Managed Care – PPO | Admitting: Family Medicine

## 2020-08-16 ENCOUNTER — Encounter: Payer: Self-pay | Admitting: Family Medicine

## 2020-08-16 ENCOUNTER — Encounter: Payer: Self-pay | Admitting: *Deleted

## 2020-08-16 ENCOUNTER — Other Ambulatory Visit: Payer: Self-pay

## 2020-08-16 VITALS — BP 118/79 | HR 66 | Temp 98.3°F | Ht 68.0 in | Wt 262.0 lb

## 2020-08-16 DIAGNOSIS — E034 Atrophy of thyroid (acquired): Secondary | ICD-10-CM | POA: Diagnosis not present

## 2020-08-16 DIAGNOSIS — Z7901 Long term (current) use of anticoagulants: Secondary | ICD-10-CM | POA: Diagnosis not present

## 2020-08-16 DIAGNOSIS — E785 Hyperlipidemia, unspecified: Secondary | ICD-10-CM

## 2020-08-16 DIAGNOSIS — Z86711 Personal history of pulmonary embolism: Secondary | ICD-10-CM | POA: Diagnosis not present

## 2020-08-16 DIAGNOSIS — I2699 Other pulmonary embolism without acute cor pulmonale: Secondary | ICD-10-CM

## 2020-08-16 DIAGNOSIS — I10 Essential (primary) hypertension: Secondary | ICD-10-CM | POA: Diagnosis not present

## 2020-08-16 DIAGNOSIS — Z8249 Family history of ischemic heart disease and other diseases of the circulatory system: Secondary | ICD-10-CM

## 2020-08-16 MED ORDER — AMLODIPINE BESY-BENAZEPRIL HCL 5-20 MG PO CAPS
ORAL_CAPSULE | ORAL | 3 refills | Status: DC
Start: 2020-08-16 — End: 2021-12-20

## 2020-08-16 MED ORDER — RIVAROXABAN 20 MG PO TABS: 20.0000 mg | ORAL_TABLET | Freq: Every day | ORAL | 1 refills | Status: DC

## 2020-08-16 MED ORDER — ATORVASTATIN CALCIUM 40 MG PO TABS: ORAL_TABLET | ORAL | 3 refills | Status: DC

## 2020-08-16 NOTE — Progress Notes (Signed)
Subjective: CC: Follow-up hypothyroidism, hypertension and chronic anticoagulation PCP: Raliegh Ip, DO KKX:FGHWEXH [name] [name] is a 49 y.o. male presenting to clinic today for:  1.  Hypothyroidism Patient reports compliance with Synthroid.  He was recently transitioned from 75 mcg to 88 mcg due to subtherapeutic levels.  He has no side effects from the medications.  He denies any change in bowel habits, difficulty with swallowing or change in voice.  No heart palpitations, tremor, change in energy.  He has been actively working on weight loss with a bout of 50 pounds success so far.  2.  Hypertension Patient is compliant with his Lotrel.  No chest pain, shortness of breath or edema  3.  History of DVT/PE Patient sustained a DVT/PE earlier this year.  He has been treated with chronic Xarelto since.  He was lost to follow-up but is here to get rechecked and determine the necessity of ongoing anticoagulation.  To summarize, his mother has history of recurrent blood clots but she also has active cancer.  He had had COVID about 2 months prior to the clot.  He weighed 50 pounds more than current weight and also had a fairly sedentary job.  However, no overt cause of the clot identified.  He has not yet seen a hematologist but would be willing to do so.   ROS: Per HPI  Allergies  Allergen Reactions   Ibuprofen Swelling   Past Medical History:  Diagnosis Date   Clotting disorder (HCC)    Hyperlipidemia    Hypertension     Current Outpatient Medications:    amLODipine-benazepril (LOTREL) 5-20 MG capsule, TAKE 1 CAPSULE BY MOUTH EVERY DAY (Needs to be seen before next refill), Disp: 90 capsule, Rfl: 0   atorvastatin (LIPITOR) 40 MG tablet, TAKE 1 TABLET (40 MG TOTAL) BY MOUTH DAILY., Disp: 90 tablet, Rfl: 0   levothyroxine (SYNTHROID) 88 MCG tablet, Take 1 tablet (88 mcg total) by mouth daily., Disp: 90 tablet, Rfl: 1   rivaroxaban (XARELTO) 20 MG TABS tablet, Take 1 tablet (20  mg total) by mouth daily with supper. (Needs to be seen before next refill), Disp: 30 tablet, Rfl: 3 Social History   Socioeconomic History   Marital status: Married    Spouse name: Not on file   Number of children: Not on file   Years of education: Not on file   Highest education level: Not on file  Occupational History   Not on file  Tobacco Use   Smoking status: Never Smoker   Smokeless tobacco: Current User    Types: Snuff  Vaping Use   Vaping Use: Never used  Substance and Sexual Activity   Alcohol use: No    Comment: 0CC   Drug use: No   Sexual activity: Not on file  Other Topics Concern   Not on file  Social History Narrative   Not on file   Social Determinants of Health   Financial Resource Strain:    Difficulty of Paying Living Expenses: Not on file  Food Insecurity:    Worried About Running Out of Food in the Last Year: Not on file   Ran Out of Food in the Last Year: Not on file  Transportation Needs:    Lack of Transportation (Medical): Not on file   Lack of Transportation (Non-Medical): Not on file  Physical Activity:    Days of Exercise per Week: Not on file   Minutes of Exercise per Session: Not on file  Stress:  Feeling of Stress : Not on file  Social Connections:    Frequency of Communication with Friends and Family: Not on file   Frequency of Social Gatherings with Friends and Family: Not on file   Attends Religious Services: Not on file   Active Member of Clubs or Organizations: Not on file   Attends Banker Meetings: Not on file   Marital Status: Not on file  Intimate Partner Violence:    Fear of Current or Ex-Partner: Not on file   Emotionally Abused: Not on file   Physically Abused: Not on file   Sexually Abused: Not on file   Family History  Problem Relation Age of Onset   Cancer Mother        breast cancer   Hypertension Mother    Heart attack Father    Diabetes Father     Hypertension Father     Objective: Office vital signs reviewed. BP 118/79    Pulse 66    Temp 98.3 F (36.8 C) (Temporal)    Ht 5\' 8"  (1.727 m)    Wt 262 lb (118.8 kg)    SpO2 99%    BMI 39.84 kg/m   Physical Examination:  General: Awake, alert, well nourished, No acute distress HEENT: Normal; sclera white.  No goiter.  No exophthalmos Cardio: regular rate and rhythm, S1S2 heard, no murmurs appreciated Pulm: clear to auscultation bilaterally, no wheezes, rhonchi or rales; normal work of breathing on room air Extremities: warm, well perfused, No edema, cyanosis or clubbing; +2 pulses bilaterally MSK: Normal gait and station Neuro: No tremor Skin: Normal temperature  Assessment/ Plan: 49 y.o. male   1. Hypothyroidism due to acquired atrophy of thyroid Most recent thyroid levels were not at goal.  His Synthroid was recently increased to 88 mcg daily.  He will follow-up in about 8 weeks for recheck of thyroid panel.  He remains asymptomatic - Thyroid Panel With TSH; Future  2. Essential hypertension Well-controlled.  Continue current regimen  3. History of pulmonary embolus (PE) I placed a referral to hematology for further evaluation given family history of coagulopathy.  His mother does have active cancer.  However, his clot does not seem to be provoked.  Risk factors included obesity and sedentary job.  His only risk was Covid infection several months prior. - Ambulatory referral to Hematology  4. Chronic anticoagulation For now continue chronic anticoagulation - Ambulatory referral to Hematology  5. Family history of blood clots - Ambulatory referral to Hematology   Orders Placed This Encounter  Procedures   Thyroid Panel With TSH    Standing Status:   Future    Standing Expiration Date:   08/16/2021   Ambulatory referral to Hematology    Referral Priority:   Routine    Referral Type:   Consultation    Referral Reason:   Specialty Services Required    Requested  Specialty:   Oncology    Number of Visits Requested:   1   No orders of the defined types were placed in this encounter.    08/18/2021, DO [facility] [phone]

## 2020-08-16 NOTE — Patient Instructions (Signed)
Thyroid check in 8 weeks. Can come in for labs without appointment.  Do not have to fast  Referring you to a specialist to determine if you need to stay on blood thinners or not.

## 2020-08-17 ENCOUNTER — Telehealth: Payer: Self-pay | Admitting: Oncology

## 2020-08-17 NOTE — Telephone Encounter (Signed)
Received a new hem referral from Dr. Nadine Counts for hx of pe. Mr. [name] [name] has been cld and scheduled to see [name] on 12/15 at 2pm. Pt aware to arrive 30 minutes early.

## 2020-09-14 ENCOUNTER — Inpatient Hospital Stay: Payer: BC Managed Care – PPO | Attending: Oncology | Admitting: Oncology

## 2020-09-14 ENCOUNTER — Other Ambulatory Visit: Payer: Self-pay

## 2020-09-14 VITALS — BP 121/78 | HR 64 | Temp 97.9°F | Resp 19 | Ht 68.0 in | Wt 261.7 lb

## 2020-09-14 DIAGNOSIS — I10 Essential (primary) hypertension: Secondary | ICD-10-CM | POA: Insufficient documentation

## 2020-09-14 DIAGNOSIS — Z79899 Other long term (current) drug therapy: Secondary | ICD-10-CM | POA: Insufficient documentation

## 2020-09-14 DIAGNOSIS — I2699 Other pulmonary embolism without acute cor pulmonale: Secondary | ICD-10-CM | POA: Diagnosis not present

## 2020-09-14 DIAGNOSIS — Z7901 Long term (current) use of anticoagulants: Secondary | ICD-10-CM

## 2020-09-14 NOTE — Progress Notes (Addendum)
Reason for the request:   Pulmonary embolism  HPI: I was asked by Dr. Nadine Counts to evaluate [name] [name] for venous thromboembolism.  [name] [name] is a 49 year old [name] with history of hypertension, hyperlipidemia and mild obesity.  [name] [name] was in his usual state of health when [name] [name] presented to the emergency department on January 20 after symptoms of shortness of breath and weakness.  At that time [name] [name] was evaluated by CT scan of the chest which showed a right upper lobe bilateral segmental acute pulmonary embolus.  [name] [name] was started on Lovenox and subsequently transitioned into Xarelto.  Hypercoagulable panel at that time did not show any inherited or acquired thrombophilia.  [name] [name] had denies any specific provoking symptoms although [name] [name] did have COVID-19 infection in October 2020.  [name] [name] describes symptoms as mild and did not require hospitalization.  [name] [name] was also on a beach trip leading up to his hospitalization in January 2021.  [name] [name] has tolerated the Xarelto without any major complications.  [name] [name] denies any hematochezia, melena or hemoptysis.  [name] [name] denies any previous thrombosis or any family history of recurrent clots.   [name] [name] does not report any headaches, blurry vision, syncope or seizures. Does not report any fevers, chills or sweats.  Does not report any cough, wheezing or hemoptysis.  Does not report any chest pain, palpitation, orthopnea or leg edema.  Does not report any nausea, vomiting or abdominal pain.  Does not report any constipation or diarrhea.  Does not report any skeletal complaints.    Does not report frequency, urgency or hematuria.  Does not report any skin rashes or lesions. Does not report any heat or cold intolerance.  Does not report any lymphadenopathy or petechiae.  Does not report any anxiety or depression.  Remaining review of systems is negative.    Past Medical History:  Diagnosis Date  . Clotting disorder (HCC)   . Hyperlipidemia   . Hypertension   :  Past Surgical History:  Procedure Laterality Date   . BICEPS TENDON REPAIR    . KNEE SURGERY Left 2003  :   Current Outpatient Medications:  .  amLODipine-benazepril (LOTREL) 5-20 MG capsule, TAKE 1 CAPSULE BY MOUTH EVERY DAY, Disp: 90 capsule, Rfl: 3 .  atorvastatin (LIPITOR) 40 MG tablet, TAKE 1 TABLET (40 MG TOTAL) BY MOUTH DAILY., Disp: 90 tablet, Rfl: 3 .  levothyroxine (SYNTHROID) 88 MCG tablet, Take 1 tablet (88 mcg total) by mouth daily., Disp: 90 tablet, Rfl: 1 .  rivaroxaban (XARELTO) 20 MG TABS tablet, Take 1 tablet (20 mg total) by mouth daily with supper., Disp: 90 tablet, Rfl: 1:  Allergies  Allergen Reactions  . Ibuprofen Swelling  :  Family History  Problem Relation Age of Onset  . Cancer Mother        breast cancer  . Hypertension Mother   . Heart attack Father   . Diabetes Father   . Hypertension Father   :  Social History   Socioeconomic History  . Marital status: Married    Spouse name: Not on file  . Number of children: Not on file  . Years of education: Not on file  . Highest education level: Not on file  Occupational History  . Not on file  Tobacco Use  . Smoking status: Never Smoker  . Smokeless tobacco: Current User    Types: Snuff  Vaping Use  . Vaping Use: Never used  Substance and Sexual Activity  . Alcohol use: No    Comment: 0CC  . Drug  use: No  . Sexual activity: Not on file  Other Topics Concern  . Not on file  Social History Narrative  . Not on file   Social Determinants of Health   Financial Resource Strain: Not on file  Food Insecurity: Not on file  Transportation Needs: Not on file  Physical Activity: Not on file  Stress: Not on file  Social Connections: Not on file  Intimate Partner Violence: Not on file  :  Pertinent items are noted in HPI.  Exam: Blood pressure 121/78, pulse 64, temperature 97.9 F (36.6 C), temperature source Tympanic, resp. rate 19, height 5\' 8"  (1.727 m), weight 261 lb 11.2 oz (118.7 kg), SpO2 100 %.  ECOG 0 General appearance: alert and  cooperative appeared without distress. Head: atraumatic without any abnormalities. Eyes: conjunctivae/corneas clear. PERRL.  Sclera anicteric. Throat: lips, mucosa, and tongue normal; without oral thrush or ulcers. Resp: clear to auscultation bilaterally without rhonchi, wheezes or dullness to percussion. Cardio: regular rate and rhythm, S1, S2 normal, no murmur, click, rub or gallop GI: soft, non-tender; bowel sounds normal; no masses,  no organomegaly Skin: Skin color, texture, turgor normal. No rashes or lesions Lymph nodes: Cervical, supraclavicular, and axillary nodes normal. Neurologic: Grossly normal without any motor, sensory or deep tendon reflexes. Musculoskeletal: No joint deformity or effusion.  IMPRESSION: 1. Right upper lobe lobar bilateral segmental acute pulmonary embolus, with right ventricular to left ventricular ratio 1.2. Positive for acute PE with CT evidence of right heart strain (RV/LV Ratio = 1.2) consistent with at least submassive (intermediate risk) PE. The presence of right heart strain has been associated with an increased risk of morbidity and mortality. Please activate Code PE by paging [phone]. 2. Mild cardiomegaly. 3. Mild distal esophageal wall thickening, the most common cause of be esophagitis. Adjacent small paraesophageal lymph nodes. 4. Suspected diffuse hepatic steatosis.  Radiology assistant personnel have been notified to put me in telephone contact with the referring physician or the referring physician's clinical representative in order to discuss these findings. Once this communication is established I will issue an addendum to this report for documentation purposes.  Assessment and Plan:   [name] [name] with:  1.  Pulmonary embolus diagnosed in January 2021.  Resented with shortness of breath found to have right upper lobe lobar bilateral segmental acute pulmonary embolus.  [name] [name] is currently on Xarelto approaching 1 year of  therapy.  [name] [name] did have COVID-19 infection 2 months leading up to that event as well as a trip to the beach just before his infection.  The natural course of this disease was discussed and treatment options were reviewed.  It is reasonable to assume that his clot is provoked by COVID-19 infection and a trip to the beach.  Although these provoking symptoms are rather weak in association and could also be treated as unprovoked.  Risks and benefits of prolonged anticoagulation versus discontinuation was discussed today.  Given his young age and overall physical occupation and susceptibility to injury and bleeding complications [name] [name] is in favor of stopping anticoagulation.  That would be reasonable given the potential provoking symptoms outlined above.  [name] [name] also understands if [name] [name] stops anticoagulation [name] [name] will be at increased risk but [name] [name] can mitigate it with eliminating high risk situations such as prolonged travel or immobilization.  [name] [name] also understands if [name] [name] develops any further clots in the future [name] [name] will require lifetime anticoagulation.  After discussion today, [name] [name] has agreed to discontinue Xarelto at this time which is reasonable given  the discussion outlined above.  2.  Covid vaccination considerations: [name] [name] is fully vaccinated after natural immunity which should offer home reasonable protection and decrease in risk of thrombosis in the future.  3.  Follow-up: I am happy to see him in the future as needed.  45  minutes were dedicated to this visit. The time was spent on reviewing laboratory data, imaging studies, discussing treatment options, and answering questions regarding future plan.   A copy of this consult has been forwarded to the requesting physician.   Addendum: The family history is positive for recurrent thromboembolism in his mother although the setting is unclear and what provoking factors might of caused it.  This does not change the recommendations outlined above.

## 2021-02-18 ENCOUNTER — Other Ambulatory Visit: Payer: Self-pay | Admitting: Family Medicine

## 2021-02-18 DIAGNOSIS — I2699 Other pulmonary embolism without acute cor pulmonale: Secondary | ICD-10-CM

## 2021-03-15 ENCOUNTER — Other Ambulatory Visit: Payer: Self-pay

## 2021-03-15 ENCOUNTER — Ambulatory Visit (INDEPENDENT_AMBULATORY_CARE_PROVIDER_SITE_OTHER): Payer: Self-pay | Admitting: Nurse Practitioner

## 2021-03-15 ENCOUNTER — Encounter: Payer: Self-pay | Admitting: Nurse Practitioner

## 2021-03-15 DIAGNOSIS — Z024 Encounter for examination for driving license: Secondary | ICD-10-CM | POA: Diagnosis not present

## 2021-03-15 LAB — URINALYSIS
Bilirubin, UA: NEGATIVE
Glucose, UA: NEGATIVE
Ketones, UA: NEGATIVE
Nitrite, UA: NEGATIVE
Protein,UA: NEGATIVE
RBC, UA: NEGATIVE
Specific Gravity, UA: 1.02 (ref 1.005–1.030)
Urobilinogen, Ur: 0.2 mg/dL (ref 0.2–1.0)
pH, UA: 5.5 (ref 5.0–7.5)

## 2021-03-15 NOTE — Progress Notes (Signed)
Private DOT physical- see scanned in document 

## 2021-03-23 ENCOUNTER — Other Ambulatory Visit: Payer: Self-pay | Admitting: Family Medicine

## 2021-03-23 NOTE — Telephone Encounter (Signed)
[place]. NTBS 30 days given 02/20/21 

## 2021-03-24 ENCOUNTER — Other Ambulatory Visit: Payer: Self-pay | Admitting: Family Medicine

## 2021-03-24 DIAGNOSIS — I2699 Other pulmonary embolism without acute cor pulmonale: Secondary | ICD-10-CM

## 2021-03-24 NOTE — Telephone Encounter (Signed)
[place]. NTBS 30 days given 02/20/21

## 2021-03-24 NOTE — Telephone Encounter (Signed)
LMTCB

## 2021-04-27 ENCOUNTER — Other Ambulatory Visit: Payer: Self-pay | Admitting: Family Medicine

## 2021-04-27 DIAGNOSIS — I2699 Other pulmonary embolism without acute cor pulmonale: Secondary | ICD-10-CM

## 2021-06-15 ENCOUNTER — Ambulatory Visit: Payer: BC Managed Care – PPO | Admitting: Family Medicine

## 2021-06-15 ENCOUNTER — Encounter: Payer: Self-pay | Admitting: Family Medicine

## 2021-06-15 ENCOUNTER — Other Ambulatory Visit: Payer: Self-pay

## 2021-06-15 VITALS — BP 119/71 | HR 82 | Temp 97.8°F | Ht 68.0 in | Wt 276.0 lb

## 2021-06-15 DIAGNOSIS — N529 Male erectile dysfunction, unspecified: Secondary | ICD-10-CM | POA: Diagnosis not present

## 2021-06-15 MED ORDER — SILDENAFIL CITRATE 20 MG PO TABS: ORAL_TABLET | ORAL | 5 refills | Status: DC

## 2021-06-15 NOTE — Progress Notes (Signed)
Subjective:  Patient ID: [name] [name], male    DOB: 05/19/1971  Age: 50 y.o. MRN: [id number]  CC: No chief complaint on file.   HPI Langston Tuberville presents for male performance. Separated 3-4 mos. Ago.. Would lose erection during sex. She was cheating.. Last month started dating. Problem persists.   Depression screen [facility] 2/9 06/15/2021 08/16/2020 08/08/2020  Decreased Interest 0 0 0  Down, Depressed, Hopeless 0 0 0  PHQ - 2 Score 0 0 0  Altered sleeping - 0 -  Tired, decreased energy - 0 -  Change in appetite - 0 -  Feeling bad or failure about yourself  - 0 -  Trouble concentrating - 0 -  Moving slowly or fidgety/restless - 0 -  Suicidal thoughts - 0 -  PHQ-9 Score - 0 -    History [name] [name] has a past medical history of Clotting disorder (HCC), Hyperlipidemia, and Hypertension.   He has a past surgical history that includes Knee surgery (Left, 2003) and Biceps tendon repair.   His family history includes Cancer in his mother; Diabetes in his father; Heart attack in his father; Hypertension in his father and mother.He reports that he has never smoked. His smokeless tobacco use includes snuff. He reports that he does not drink alcohol and does not use drugs.    ROS Review of Systems  Constitutional:  Negative for fever.  Respiratory:  Negative for shortness of breath.   Cardiovascular:  Negative for chest pain.  Musculoskeletal:  Negative for arthralgias.  Skin:  Negative for rash.   Objective:  BP 119/71   Pulse 82   Temp 97.8 F (36.6 C)   Ht 5\' 8"  (1.727 m)   Wt 276 lb (125.2 kg)   SpO2 96%   BMI 41.97 kg/m   BP Readings from Last 3 Encounters:  06/15/21 119/71  09/14/20 121/78  08/16/20 118/79    Wt Readings from Last 3 Encounters:  06/15/21 276 lb (125.2 kg)  09/14/20 261 lb 11.2 oz (118.7 kg)  08/16/20 262 lb (118.8 kg)     Physical Exam Vitals reviewed.  Constitutional:      Appearance: He is well-developed.  HENT:     Head: Normocephalic  and atraumatic.     Right Ear: External ear normal.     Left Ear: External ear normal.     Mouth/Throat:     Pharynx: No oropharyngeal exudate or posterior oropharyngeal erythema.  Eyes:     Pupils: Pupils are equal, round, and reactive to light.  Cardiovascular:     Rate and Rhythm: Normal rate and regular rhythm.     Heart sounds: No murmur heard. Pulmonary:     Effort: No respiratory distress.     Breath sounds: Normal breath sounds.  Musculoskeletal:     Cervical back: Normal range of motion and neck supple.  Neurological:     Mental Status: He is alert and oriented to person, place, and time.      Assessment & Plan:   Diagnoses and all orders for this visit:  Erectile dysfunction, unspecified erectile dysfunction type  Other orders -     sildenafil (REVATIO) 20 MG tablet; Take 2-5 pills at once, orally, with each sexual encounter      I am having 08/18/20 start on sildenafil. I am also having him maintain his amLODipine-benazepril, atorvastatin, levothyroxine, and rivaroxaban.  Allergies as of 06/15/2021       Reactions   Ibuprofen Swelling  Medication List        Accurate as of June 15, 2021 11:59 PM. If you have any questions, ask your nurse or doctor.          amLODipine-benazepril 5-20 MG capsule Commonly known as: LOTREL TAKE 1 CAPSULE BY MOUTH EVERY DAY   atorvastatin 40 MG tablet Commonly known as: LIPITOR TAKE 1 TABLET (40 MG TOTAL) BY MOUTH DAILY.   levothyroxine 88 MCG tablet Commonly known as: SYNTHROID Take 1 tablet (88 mcg total) by mouth daily. (NEEDS TO BE SEEN BEFORE NEXT REFILL)   rivaroxaban 20 MG Tabs tablet Commonly known as: Xarelto Take 1 tablet (20 mg total) by mouth daily with supper. (NEEDS TO BE SEEN BEFORE NEXT REFILL)   sildenafil 20 MG tablet Commonly known as: REVATIO Take 2-5 pills at once, orally, with each sexual encounter Started by: Mechele Claude, MD         Follow-up: Return if  symptoms worsen or fail to improve.  Mechele Claude, M.D.

## 2021-06-18 ENCOUNTER — Encounter: Payer: Self-pay | Admitting: Family Medicine

## 2021-07-15 ENCOUNTER — Other Ambulatory Visit: Payer: Self-pay | Admitting: Family Medicine

## 2021-07-24 ENCOUNTER — Encounter: Payer: Self-pay | Admitting: Family Medicine

## 2021-07-24 ENCOUNTER — Other Ambulatory Visit: Payer: Self-pay

## 2021-07-24 ENCOUNTER — Ambulatory Visit: Payer: BC Managed Care – PPO | Admitting: Family Medicine

## 2021-07-24 VITALS — BP 125/87 | HR 70 | Temp 97.5°F | Ht 68.0 in | Wt 283.0 lb

## 2021-07-24 DIAGNOSIS — M25562 Pain in left knee: Secondary | ICD-10-CM

## 2021-07-24 MED ORDER — METHYLPREDNISOLONE ACETATE 40 MG/ML IJ SUSP
60.0000 mg | Freq: Once | INTRAMUSCULAR | Status: AC
  Administered 2021-07-24: 60 mg via INTRA_ARTICULAR

## 2021-07-24 NOTE — Progress Notes (Signed)
Subjective:  Patient ID: [name] [name], male    DOB: 22-Sep-1971, 50 y.o.   MRN: [id number]  Patient Care Team: Raliegh Ip, DO as PCP - General (Family Medicine)   Chief Complaint:  Knee Pain   HPI: [name] [name] is a 50 y.o. male presenting on 07/24/2021 for Knee Pain   Pt presents today with complaints of left knee pain. He has had similar in the past and joint injections were beneficial. Last injection over 12 months ago. Prior imaging of bilateral knees reviewed personally and revealed mild degenerative changes. He states Saturday he stepped out of his truck and felt sharp pain. States he has been having pain since this time. He has not been wearing a brace. He denies weakness, loss of function, deformity, ecchymosis, erythema, fever, chills, weakness, or confusion.   Knee Pain  The incident occurred 2 days ago. The incident occurred at home. The injury mechanism was a twisting injury. The pain is present in the left knee. The quality of the pain is described as aching, burning and shooting. The pain is at a severity of 6/10. The pain is moderate. The pain has been Fluctuating since onset. Pertinent negatives include no inability to bear weight, loss of motion, loss of sensation, muscle weakness, numbness or tingling. He reports no foreign bodies present. The symptoms are aggravated by movement, weight bearing and palpation. He has tried elevation and ice for the symptoms. The treatment provided no relief.   Relevant past medical, surgical, family, and social history reviewed and updated as indicated.  Allergies and medications reviewed and updated. Data reviewed: Chart in Epic.   Past Medical History:  Diagnosis Date   Clotting disorder (HCC)    Hyperlipidemia    Hypertension     Past Surgical History:  Procedure Laterality Date   BICEPS TENDON REPAIR     KNEE SURGERY Left 2003    Social History   Socioeconomic History   Marital status: Married    Spouse  name: Not on file   Number of children: Not on file   Years of education: Not on file   Highest education level: Not on file  Occupational History   Not on file  Tobacco Use   Smoking status: Never   Smokeless tobacco: Current    Types: Snuff  Vaping Use   Vaping Use: Never used  Substance and Sexual Activity   Alcohol use: No    Comment: 0CC   Drug use: No   Sexual activity: Not on file  Other Topics Concern   Not on file  Social History Narrative   Not on file   Social Determinants of Health   Financial Resource Strain: Not on file  Food Insecurity: Not on file  Transportation Needs: Not on file  Physical Activity: Not on file  Stress: Not on file  Social Connections: Not on file  Intimate Partner Violence: Not on file    Outpatient Encounter Medications as of 07/24/2021  Medication Sig   amLODipine-benazepril (LOTREL) 5-20 MG capsule TAKE 1 CAPSULE BY MOUTH EVERY DAY   atorvastatin (LIPITOR) 40 MG tablet TAKE 1 TABLET (40 MG TOTAL) BY MOUTH DAILY.   levothyroxine (SYNTHROID) 88 MCG tablet Take 1 tablet (88 mcg total) by mouth daily. (NEEDS TO BE SEEN BEFORE NEXT REFILL)   sildenafil (REVATIO) 20 MG tablet TAKE 2-5 TABLETS BY MOUTH AT ONCE, WITH EACH SEXUAL ENCOUNTER   [DISCONTINUED] rivaroxaban (XARELTO) 20 MG TABS tablet Take 1 tablet (20 mg total)  by mouth daily with supper. (NEEDS TO BE SEEN BEFORE NEXT REFILL)   [EXPIRED] methylPREDNISolone acetate (DEPO-MEDROL) injection 60 mg    No facility-administered encounter medications on file as of 07/24/2021.    Allergies  Allergen Reactions   Ibuprofen Swelling    Review of Systems  Constitutional:  Negative for activity change, appetite change, chills, diaphoresis, fatigue, fever and unexpected weight change.  HENT: Negative.    Eyes: Negative.   Respiratory:  Negative for cough, chest tightness and shortness of breath.   Cardiovascular:  Negative for chest pain, palpitations and leg swelling.   Gastrointestinal:  Negative for blood in stool, constipation, diarrhea, nausea and vomiting.  Endocrine: Negative.   Genitourinary:  Negative for decreased urine volume, dysuria, frequency and urgency.  Musculoskeletal:  Positive for arthralgias, gait problem and joint swelling. Negative for back pain, myalgias, neck pain and neck stiffness.  Skin: Negative.   Allergic/Immunologic: Negative.   Neurological:  Negative for dizziness, tingling, weakness, numbness and headaches.  Hematological: Negative.   Psychiatric/Behavioral:  Negative for confusion, hallucinations, sleep disturbance and suicidal ideas.   All other systems reviewed and are negative.      Objective:  BP 125/87   Pulse 70   Temp (!) 97.5 F (36.4 C)   Ht 5\' 8"  (1.727 m)   Wt 283 lb (128.4 kg)   SpO2 98%   BMI 43.03 kg/m    Wt Readings from Last 3 Encounters:  07/24/21 283 lb (128.4 kg)  06/15/21 276 lb (125.2 kg)  09/14/20 261 lb 11.2 oz (118.7 kg)    Physical Exam Vitals and nursing note reviewed.  Constitutional:      General: He is not in acute distress.    Appearance: Normal appearance. He is obese. He is not ill-appearing, toxic-appearing or diaphoretic.  HENT:     Head: Normocephalic and atraumatic.     Nose: Nose normal.     Mouth/Throat:     Mouth: Mucous membranes are moist.     Pharynx: Oropharynx is clear.  Eyes:     Conjunctiva/sclera: Conjunctivae normal.     Pupils: Pupils are equal, round, and reactive to light.  Cardiovascular:     Rate and Rhythm: Normal rate and regular rhythm.     Pulses: Normal pulses.     Heart sounds: Normal heart sounds.  Pulmonary:     Effort: Pulmonary effort is normal.     Breath sounds: Normal breath sounds.  Musculoskeletal:     Cervical back: Normal range of motion.     Right upper leg: Normal.     Left upper leg: Normal.     Right knee: Normal.     Left knee: Swelling present. No deformity, effusion, erythema, ecchymosis, lacerations, bony  tenderness or crepitus. Normal range of motion (pain with wieght bearing and flexion). Tenderness present over the medial joint line and lateral joint line. No MCL, LCL, ACL, PCL or patellar tendon tenderness. No LCL laxity, MCL laxity, ACL laxity or PCL laxity.Normal alignment, normal meniscus and normal patellar mobility. Normal pulse.     Right lower leg: Normal.     Left lower leg: Normal.  Skin:    General: Skin is warm and dry.     Capillary Refill: Capillary refill takes less than 2 seconds.  Neurological:     General: No focal deficit present.     Mental Status: He is alert and oriented to person, place, and time.     Gait: Gait abnormal (antalgic, favoring left knee).  Psychiatric:        Mood and Affect: Mood normal.        Behavior: Behavior normal.        Thought Content: Thought content normal.        Judgment: Judgment normal.    Results for orders placed or performed in visit on 03/15/21  Urinalysis  Result Value Ref Range   Specific Gravity, UA 1.020 1.005 - 1.030   pH, UA 5.5 5.0 - 7.5   Color, UA Yellow Yellow   Appearance Ur Clear Clear   Leukocytes,UA Trace (A) Negative   Protein,UA Negative Negative/Trace   Glucose, UA Negative Negative   Ketones, UA Negative Negative   RBC, UA Negative Negative   Bilirubin, UA Negative Negative   Urobilinogen, Ur 0.2 0.2 - 1.0 mg/dL   Nitrite, UA Negative Negative     Joint Injection/Arthrocentesis  Date/Time: 07/24/2021 10:44 AM Performed by: Sonny Masters, FNP Authorized by: Sonny Masters, FNP  Indications: joint swelling and pain  Body area: knee Joint: left knee Local anesthesia used: yes  Anesthesia: Local anesthesia used: yes Local Anesthetic: lidocaine spray  Sedation: Patient sedated: no  Preparation: Patient was prepped and draped in the usual sterile fashion (Written consent obtained. Procedure discussed in detail. Pts name, DOB, and procedure verified prior to proceeding.). Needle size: 22  G Ultrasound guidance: no Approach: anterior Aspirate amount: 0 mL Methylprednisolone amount: 60 mg Patient tolerance: patient tolerated the procedure well with no immediate complications     Pertinent labs & imaging results that were available during my care of the patient were reviewed by me and considered in my medical decision making.  Assessment & Plan:  [name] [name] was seen today for knee pain.  Diagnoses and all orders for this visit:  Acute pain of left knee Degenerative changes on prior imaging. No deformity, swelling, erythema, or systemic signs of infection. Joint injection in office today. Symptomatic care discussed in detail. Pt aware to report any new, worsening, or persistent symptoms. Follow up in 6 weeks or sooner if warranted.  -     Joint Injection/Arthrocentesis -     methylPREDNISolone acetate (DEPO-MEDROL) injection 60 mg    Continue all other maintenance medications.  Follow up plan: Return in about 6 weeks (around 09/04/2021), or if symptoms worsen or fail to improve, for knee pain.   Continue healthy lifestyle choices, including diet (rich in fruits, vegetables, and lean proteins, and low in salt and simple carbohydrates) and exercise (at least 30 minutes of moderate physical activity daily).  Educational handout given for joint injection  The above assessment and management plan was discussed with the patient. The patient verbalized understanding of and has agreed to the management plan. Patient is aware to call the clinic if they develop any new symptoms or if symptoms persist or worsen. Patient is aware when to return to the clinic for a follow-up visit. Patient educated on when it is appropriate to go to the emergency department.   Kari Baars, FNP-C [facility] [phone]

## 2021-08-24 IMAGING — US US EXTREM LOW VENOUS*R*
1 series · 14 of 24 positions shown · non-contrast
Comparison: None

CLINICAL DATA: Pain x2 weeks

EXAM:
RIGHT LOWER EXTREMITY VENOUS DOPPLER ULTRASOUND
TECHNIQUE: Gray-scale sonography with compression, as well as color and duplex
ultrasound, were performed to evaluate the deep venous system(s)
from the level of the common femoral vein through the popliteal and
proximal calf veins.

[Series 1: us extrem low venous*right* · 0.09mm/px · 14 of 48 slices shown]
[im 1/48]
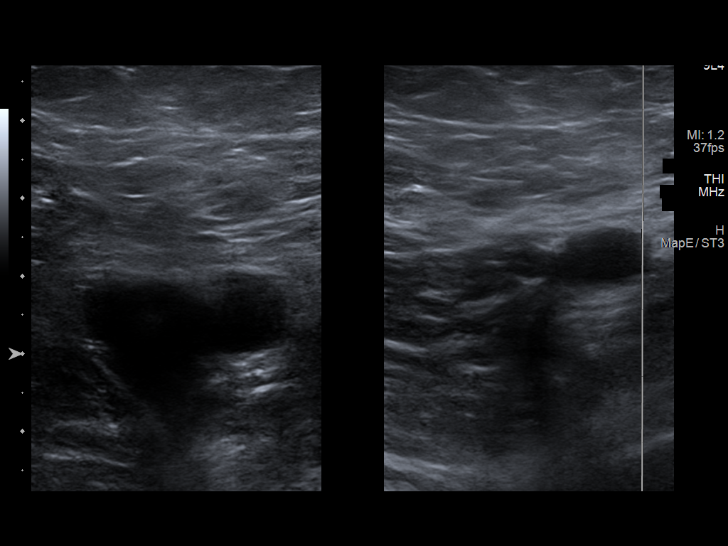
[im 5/48]
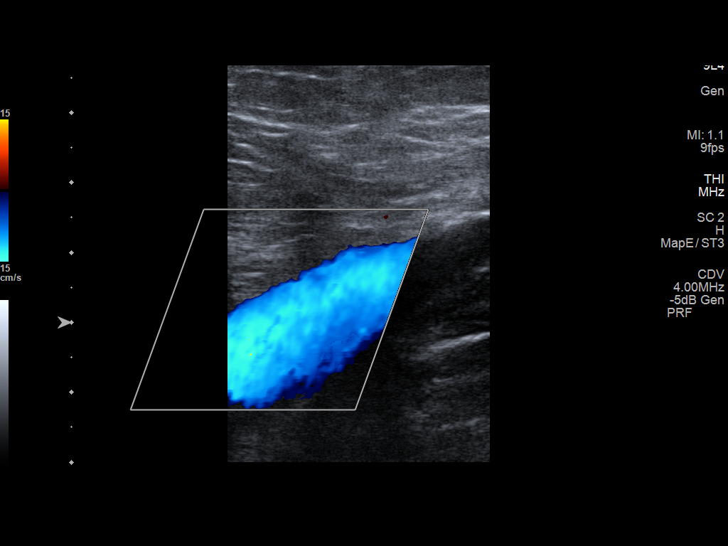
[im 9/48]
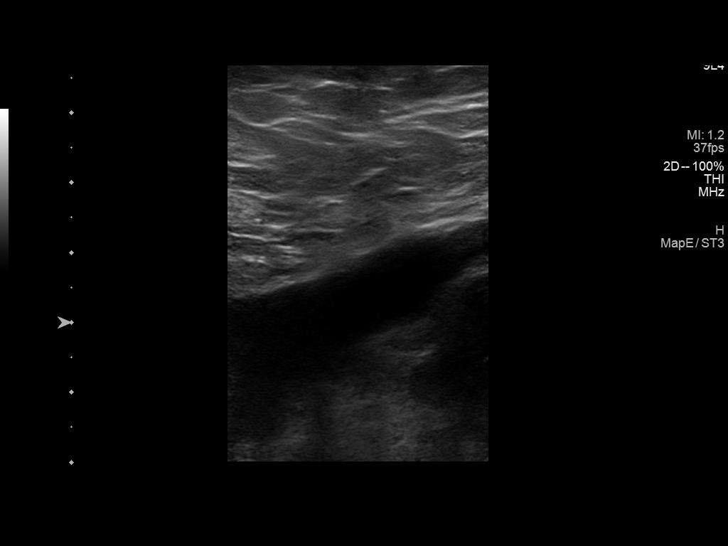
[im 13/48]
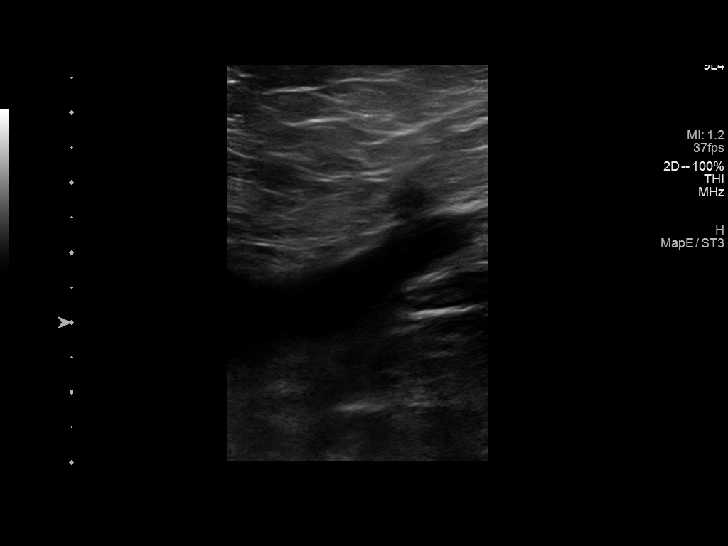
[im 15/48]
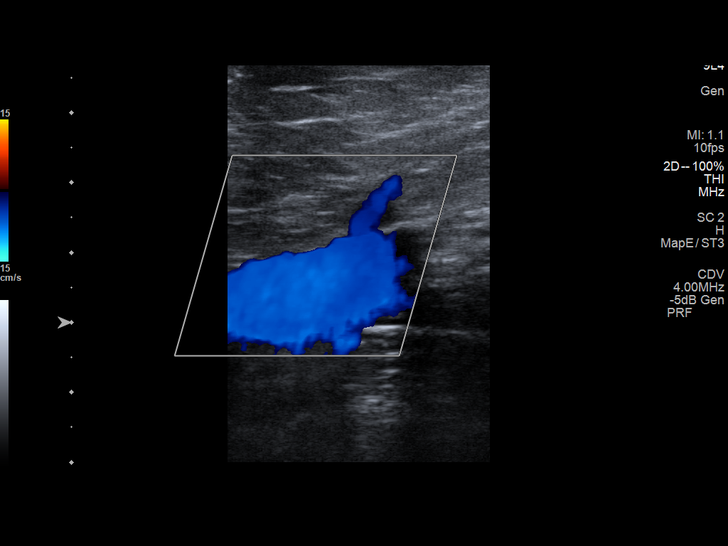
[im 19/48]
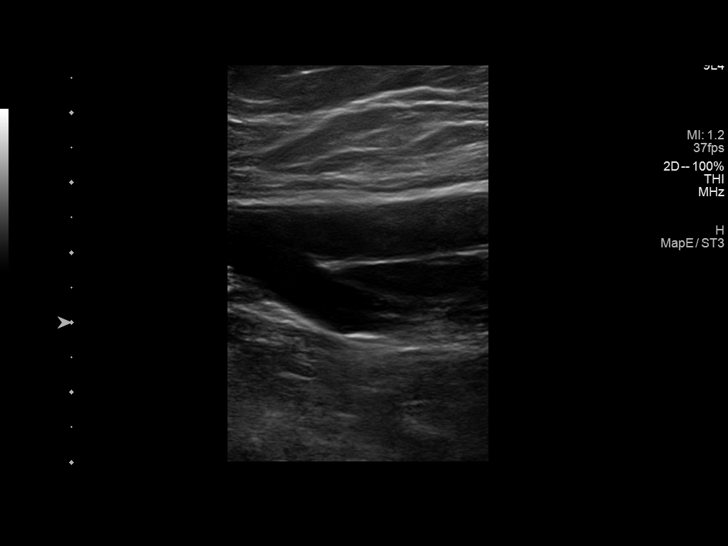
[im 23/48]
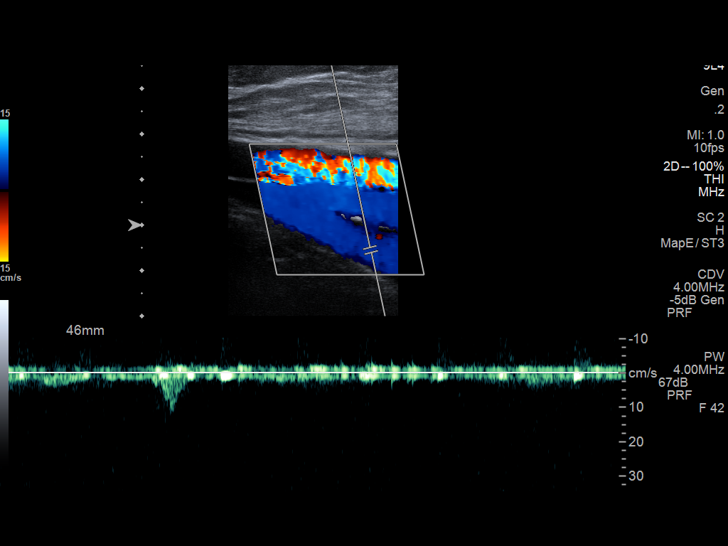
[im 25/48]
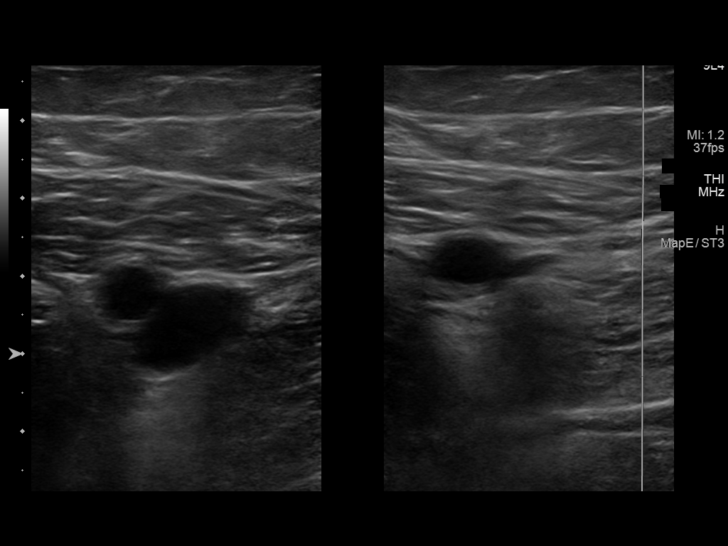
[im 29/48]
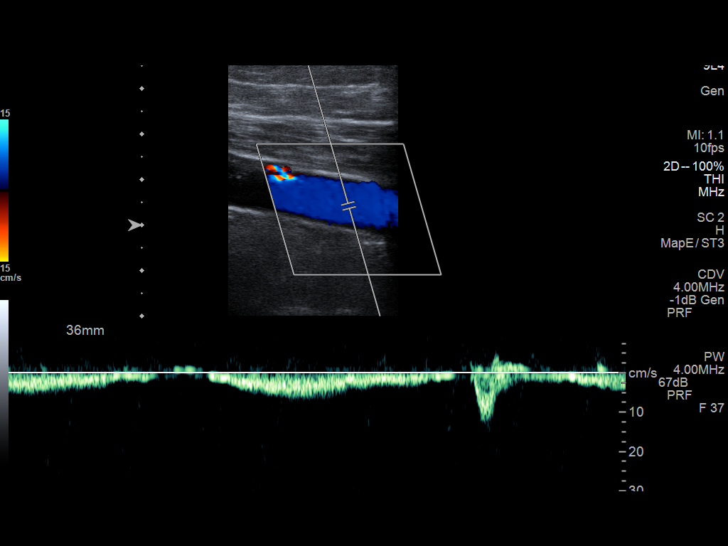
[im 33/48]
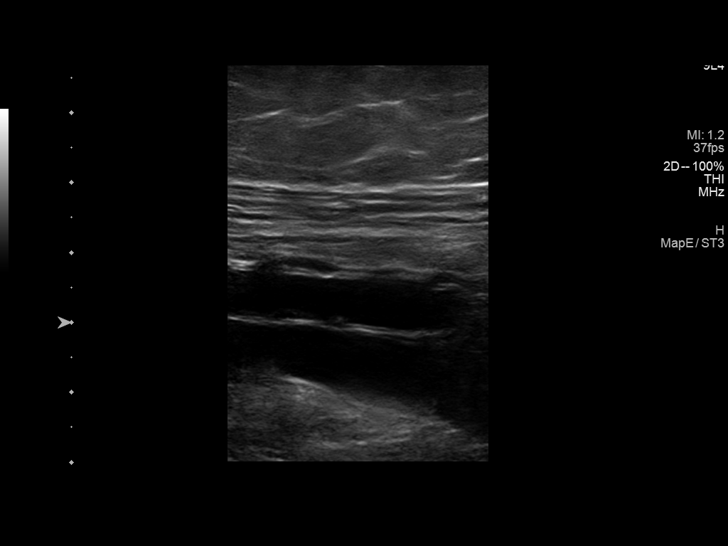
[im 37/48]
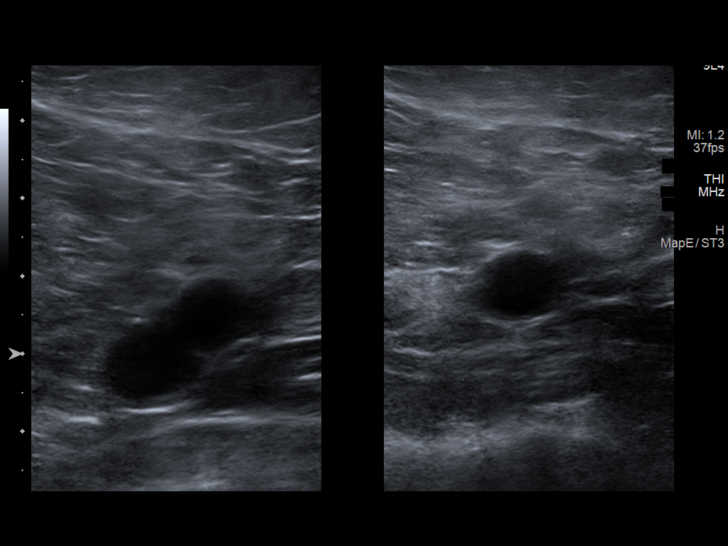
[im 39/48]
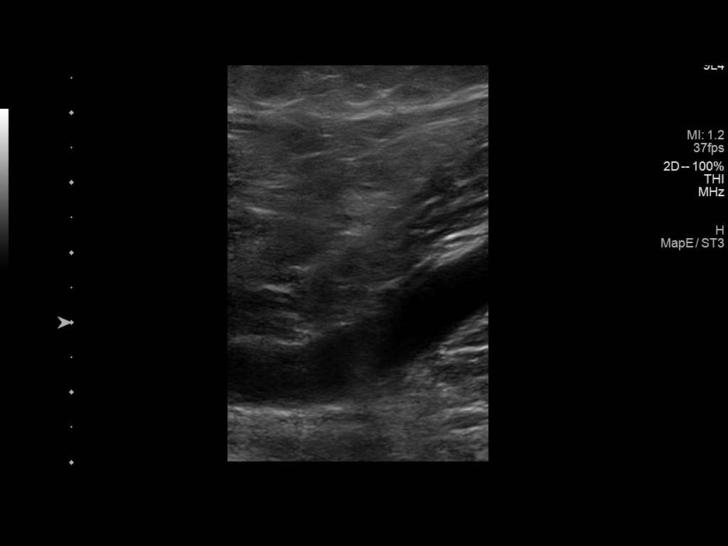
[im 43/48]
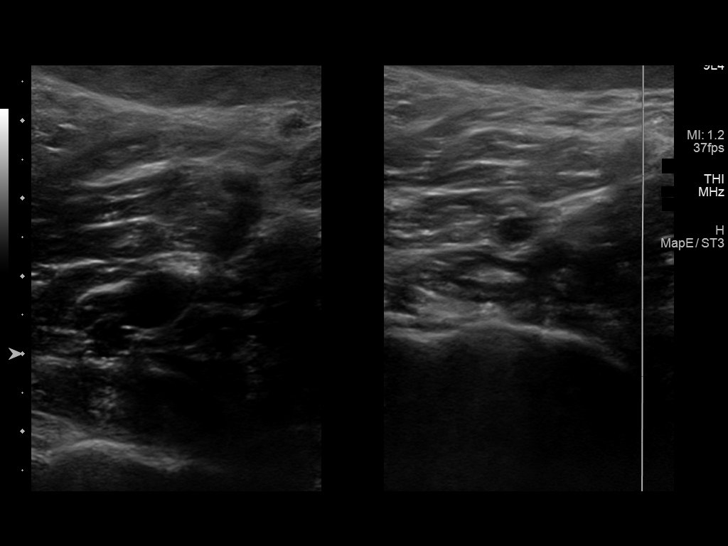
[im 48/48]
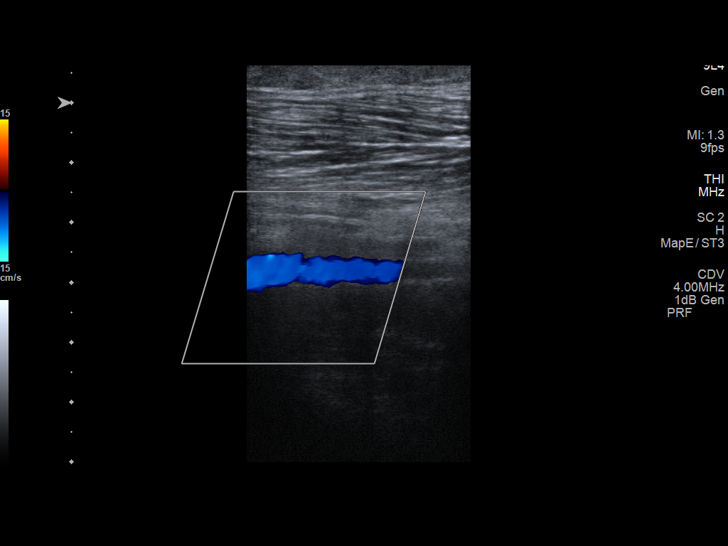

[14 of 24 positions shown; findings below may reference images not displayed]

FINDINGS: VENOUS

Normal compressibility of the common femoral, superficial femoral,
and popliteal veins, as well as the visualized calf veins.
Visualized portions of profunda femoral vein and great saphenous
vein unremarkable. No filling defects to suggest DVT on grayscale or
color Doppler imaging. Doppler waveforms show normal direction of
venous flow, normal respiratory phasicity and response to
augmentation.

Limited views of the contralateral common femoral vein are
unremarkable.

OTHER

None.

Limitations: none
IMPRESSION: No femoropopliteal DVT nor evidence of DVT within the visualized
calf veins.
If clinical symptoms are inconsistent or if there are persistent or
worsening symptoms, further imaging (possibly involving the iliac
veins) may be warranted.

## 2021-09-12 ENCOUNTER — Ambulatory Visit: Payer: BC Managed Care – PPO | Admitting: Family Medicine

## 2021-09-12 ENCOUNTER — Encounter: Payer: Self-pay | Admitting: Family Medicine

## 2021-09-12 VITALS — BP 129/76 | HR 70 | Temp 97.8°F | Ht 68.0 in | Wt 288.0 lb

## 2021-09-12 DIAGNOSIS — H6122 Impacted cerumen, left ear: Secondary | ICD-10-CM

## 2021-09-12 DIAGNOSIS — R079 Chest pain, unspecified: Secondary | ICD-10-CM

## 2021-09-12 DIAGNOSIS — Z86711 Personal history of pulmonary embolism: Secondary | ICD-10-CM

## 2021-09-12 DIAGNOSIS — R232 Flushing: Secondary | ICD-10-CM | POA: Diagnosis not present

## 2021-09-12 DIAGNOSIS — R5383 Other fatigue: Secondary | ICD-10-CM | POA: Diagnosis not present

## 2021-09-12 DIAGNOSIS — J069 Acute upper respiratory infection, unspecified: Secondary | ICD-10-CM | POA: Diagnosis not present

## 2021-09-12 DIAGNOSIS — E034 Atrophy of thyroid (acquired): Secondary | ICD-10-CM

## 2021-09-12 LAB — VERITOR FLU A/B WAIVED
Influenza A: NEGATIVE
Influenza B: NEGATIVE

## 2021-09-12 MED ORDER — DEBROX 6.5 % OT SOLN: 5.0000 [drp] | Freq: Two times a day (BID) | OTIC | 0 refills | Status: DC

## 2021-09-12 NOTE — Progress Notes (Signed)
Subjective:  Patient ID: [name] [name], male    DOB: 15-May-1971, 50 y.o.   MRN: [id number]  Patient Care Team: Janora Norlander, DO as PCP - General (Family Medicine)   Chief Complaint:  Fatigue (Cough, chest aching, flush face)   HPI: [name] [name] is a 50 y.o. male presenting on 09/12/2021 for Fatigue (Cough, chest aching, flush face)   Pt presents today with complaints of fatigue, facial flushing, cough, congestion, chest aching, and rhinorrhea. States this started 2 days ago and does not seem to be improving. States the chest aching only occurs after coughing and only lasts a few seconds. No associated arm, jaw, or neck pain. No nausea, vomiting, diaphoresis, palpitations, shortness of breath, dizziness, or syncope. No leg swelling. He states he usually only sleeps 6 hours but has been sleeping more than 8 for the past 2 nights. He has not tried anything for his symptoms. He does have a prior history of PE but denies shortness of breath, palpitations, or leg swelling.      Relevant past medical, surgical, family, and social history reviewed and updated as indicated.  Allergies and medications reviewed and updated. Data reviewed: Chart in Epic.   Past Medical History:  Diagnosis Date   Clotting disorder ([place])    Hyperlipidemia    Hypertension     Past Surgical History:  Procedure Laterality Date   BICEPS TENDON REPAIR     KNEE SURGERY Left 2003    Social History   Socioeconomic History   Marital status: Married    Spouse name: Not on file   Number of children: Not on file   Years of education: Not on file   Highest education level: Not on file  Occupational History   Not on file  Tobacco Use   Smoking status: Never   Smokeless tobacco: Current    Types: Snuff  Vaping Use   Vaping Use: Never used  Substance and Sexual Activity   Alcohol use: No    Comment: [place]   Drug use: No   Sexual activity: Not on file  Other Topics Concern   Not on file   Social History Narrative   Not on file   Social Determinants of Health   Financial Resource Strain: Not on file  Food Insecurity: Not on file  Transportation Needs: Not on file  Physical Activity: Not on file  Stress: Not on file  Social Connections: Not on file  Intimate Partner Violence: Not on file    Outpatient Encounter Medications as of 09/12/2021  Medication Sig   amLODipine-benazepril (LOTREL) 5-20 MG capsule TAKE 1 CAPSULE BY MOUTH EVERY DAY   atorvastatin (LIPITOR) 40 MG tablet TAKE 1 TABLET (40 MG TOTAL) BY MOUTH DAILY.   carbamide peroxide (DEBROX) 6.5 % OTIC solution Place 5 drops into the left ear 2 (two) times daily.   levothyroxine (SYNTHROID) 88 MCG tablet Take 1 tablet (88 mcg total) by mouth daily. (NEEDS TO BE SEEN BEFORE NEXT REFILL)   sildenafil (REVATIO) 20 MG tablet TAKE 2-5 TABLETS BY MOUTH AT ONCE, WITH EACH SEXUAL ENCOUNTER   No facility-administered encounter medications on file as of 09/12/2021.    Allergies  Allergen Reactions   Ibuprofen Swelling    Review of Systems  Constitutional:  Positive for activity change and fatigue. Negative for appetite change, chills, diaphoresis, fever and unexpected weight change.  HENT:  Positive for congestion, postnasal drip and rhinorrhea. Negative for drooling, ear discharge, ear pain, facial swelling, hearing loss,  mouth sores, nosebleeds, sinus pressure, sinus pain, sneezing, sore throat, tinnitus, trouble swallowing and voice change.   Eyes: Negative.   Respiratory:  Positive for cough. Negative for apnea, choking, chest tightness, shortness of breath, wheezing and stridor.   Cardiovascular:  Positive for chest pain (aching). Negative for palpitations and leg swelling.  Gastrointestinal: Negative.   Endocrine: Negative for cold intolerance.  Genitourinary: Negative.  Negative for decreased urine volume and difficulty urinating.  Musculoskeletal: Negative.   Skin:  Positive for color change (facial  flushing).  Allergic/Immunologic: Negative.   Neurological: Negative.  Negative for dizziness, tremors, seizures, syncope, facial asymmetry, speech difficulty, weakness, light-headedness, numbness and headaches.  Hematological: Negative.   Psychiatric/Behavioral: Negative.  Negative for confusion.   All other systems reviewed and are negative.      Objective:  BP 129/76    Pulse 70    Temp 97.8 F (36.6 C)    Ht $R'5\' 8"'WF$  (1.727 m)    Wt 288 lb (130.6 kg)    SpO2 97%    BMI 43.79 kg/m    Wt Readings from Last 3 Encounters:  09/12/21 288 lb (130.6 kg)  07/24/21 283 lb (128.4 kg)  06/15/21 276 lb (125.2 kg)    Physical Exam Vitals and nursing note reviewed.  Constitutional:      General: He is not in acute distress.    Appearance: Normal appearance. He is well-developed and well-groomed. He is obese. He is not ill-appearing, toxic-appearing or diaphoretic.  HENT:     Head: Normocephalic and atraumatic.     Jaw: There is normal jaw occlusion.     Right Ear: Hearing, tympanic membrane, ear canal and external ear normal.     Left Ear: Hearing normal. There is impacted cerumen.     Nose: Nose normal.     Mouth/Throat:     Lips: Pink.     Mouth: Mucous membranes are moist.     Pharynx: Oropharynx is clear. Uvula midline. Posterior oropharyngeal erythema present. No pharyngeal swelling, oropharyngeal exudate or uvula swelling.     Tonsils: No tonsillar exudate or tonsillar abscesses.  Eyes:     General: Lids are normal.     Extraocular Movements: Extraocular movements intact.     Conjunctiva/sclera: Conjunctivae normal.     Pupils: Pupils are equal, round, and reactive to light.  Neck:     Thyroid: No thyroid mass, thyromegaly or thyroid tenderness.     Vascular: No carotid bruit or JVD.     Trachea: Trachea and phonation normal.  Cardiovascular:     Rate and Rhythm: Normal rate and regular rhythm.     Chest Wall: PMI is not displaced.     Pulses: Normal pulses.     Heart sounds:  Normal heart sounds. No murmur heard.   No friction rub. No gallop.  Pulmonary:     Effort: Pulmonary effort is normal. No respiratory distress.     Breath sounds: Normal breath sounds. No wheezing, rhonchi or rales.  Abdominal:     General: Bowel sounds are normal. There is no distension or abdominal bruit.     Palpations: Abdomen is soft. There is no hepatomegaly or splenomegaly.     Tenderness: There is no abdominal tenderness. There is no right CVA tenderness or left CVA tenderness.     Hernia: No hernia is present.  Musculoskeletal:        General: Normal range of motion.     Cervical back: Normal range of motion and neck supple.  Right lower leg: No edema.     Left lower leg: No edema.  Lymphadenopathy:     Cervical: No cervical adenopathy.  Skin:    General: Skin is warm and dry.     Capillary Refill: Capillary refill takes less than 2 seconds.     Coloration: Skin is not cyanotic, jaundiced or pale.     Findings: No rash.  Neurological:     General: No focal deficit present.     Mental Status: He is alert and oriented to person, place, and time.     Sensory: Sensation is intact.     Motor: Motor function is intact.     Coordination: Coordination is intact.     Gait: Gait is intact.     Deep Tendon Reflexes: Reflexes are normal and symmetric.  Psychiatric:        Attention and Perception: Attention and perception normal.        Mood and Affect: Mood and affect normal.        Speech: Speech normal.        Behavior: Behavior normal. Behavior is cooperative.        Thought Content: Thought content normal.        Cognition and Memory: Cognition and memory normal.        Judgment: Judgment normal.    Results for orders placed or performed in visit on 03/15/21  Urinalysis  Result Value Ref Range   Specific Gravity, UA 1.020 1.005 - 1.030   pH, UA 5.5 5.0 - 7.5   Color, UA Yellow Yellow   Appearance Ur Clear Clear   Leukocytes,UA Trace (A) Negative   Protein,UA  Negative Negative/Trace   Glucose, UA Negative Negative   Ketones, UA Negative Negative   RBC, UA Negative Negative   Bilirubin, UA Negative Negative   Urobilinogen, Ur 0.2 0.2 - 1.0 mg/dL   Nitrite, UA Negative Negative     EKG: SR with RBBB, 61, PR 162 ms, QT 432 ms, no acute ST-T changes, no ectopy. No changes from prior EKG. Monia Pouch, FNP-C.   Pertinent labs & imaging results that were available during my care of the patient were reviewed by me and considered in my medical decision making.  Assessment & Plan:  Vashon was seen today for fatigue.  Diagnoses and all orders for this visit:  Chest pain in adult History of PE Fatigue Flushing No clinical indications of PE. EKG without acute changes. Influenza negative. COVID pending. Will check CBC, CMP, thyroid function, and D-dimer. Further treatment pending lab results.  -     EKG 12-Lead -     CBC with Differential/Platelet -     CMP14+EGFR -     Thyroid Panel With TSH -     Veritor Flu A/B Waived -     Novel Coronavirus, NAA ([facility]) -     D-dimer, quantitative  URI with cough and congestion Influenza negative. COVID pending. Symptomatic care discussed in detail. Report any new, worsening, or persistent symptoms. Further treatment pending results.  -     Veritor Flu A/B Waived -     Novel Coronavirus, NAA ([facility])  Impacted cerumen of left ear Debrox as prescribed to soften wax.  -     carbamide peroxide (DEBROX) 6.5 % OTIC solution; Place 5 drops into the left ear 2 (two) times daily.     Continue all other maintenance medications.  Follow up plan: Return if symptoms worsen or fail to improve.   Continue  healthy lifestyle choices, including diet (rich in fruits, vegetables, and lean proteins, and low in salt and simple carbohydrates) and exercise (at least 30 minutes of moderate physical activity daily).  Educational handout given for URI  The above assessment and management plan was discussed with the  patient. The patient verbalized understanding of and has agreed to the management plan. Patient is aware to call the clinic if they develop any new symptoms or if symptoms persist or worsen. Patient is aware when to return to the clinic for a follow-up visit. Patient educated on when it is appropriate to go to the emergency department.   Monia Pouch, FNP-C [facility] [phone]

## 2021-09-13 LAB — CBC WITH DIFFERENTIAL/PLATELET
Basophils Absolute: 0.1 10*3/uL (ref 0.0–0.2)
Basos: 1 %
EOS (ABSOLUTE): 0.2 10*3/uL (ref 0.0–0.4)
Eos: 2 %
Hematocrit: 47 % (ref 37.5–51.0)
Hemoglobin: 15.4 g/dL (ref 13.0–17.7)
Immature Grans (Abs): 0.1 10*3/uL (ref 0.0–0.1)
Immature Granulocytes: 1 %
Lymphocytes Absolute: 3.1 10*3/uL (ref 0.7–3.1)
Lymphs: 33 %
MCH: 28.9 pg (ref 26.6–33.0)
MCHC: 32.8 g/dL (ref 31.5–35.7)
MCV: 88 fL (ref 79–97)
Monocytes Absolute: 0.7 10*3/uL (ref 0.1–0.9)
Monocytes: 7 %
Neutrophils Absolute: 5.3 10*3/uL (ref 1.4–7.0)
Neutrophils: 56 %
Platelets: 247 10*3/uL (ref 150–450)
RBC: 5.32 x10E6/uL (ref 4.14–5.80)
RDW: 14.8 % (ref 11.6–15.4)
WBC: 9.4 10*3/uL (ref 3.4–10.8)

## 2021-09-13 LAB — CMP14+EGFR
ALT: 28 IU/L (ref 0–44)
AST: 15 IU/L (ref 0–40)
Albumin/Globulin Ratio: 1.5 (ref 1.2–2.2)
Albumin: 4.4 g/dL (ref 3.8–4.9)
Alkaline Phosphatase: 68 IU/L (ref 44–121)
BUN/Creatinine Ratio: 10 (ref 9–20)
BUN: 11 mg/dL (ref 6–24)
Bilirubin Total: 0.3 mg/dL (ref 0.0–1.2)
CO2: 22 mmol/L (ref 20–29)
Calcium: 10 mg/dL (ref 8.7–10.2)
Chloride: 103 mmol/L (ref 96–106)
Creatinine, Ser: 1.08 mg/dL (ref 0.76–1.27)
Globulin, Total: 2.9 g/dL (ref 1.5–4.5)
Glucose: 90 mg/dL (ref 70–99)
Potassium: 4.6 mmol/L (ref 3.5–5.2)
Sodium: 141 mmol/L (ref 134–144)
Total Protein: 7.3 g/dL (ref 6.0–8.5)
eGFR: 83 mL/min/{1.73_m2} (ref >59)

## 2021-09-13 LAB — THYROID PANEL WITH TSH
Free Thyroxine Index: 1.3 (ref 1.2–4.9)
T3 Uptake Ratio: 31 % (ref 24–39)
T4, Total: 4.3 ug/dL — ABNORMAL LOW (ref 4.5–12.0)
TSH: 6.91 u[IU]/mL — ABNORMAL HIGH (ref 0.450–4.500)

## 2021-09-13 LAB — D-DIMER, QUANTITATIVE: D-DIMER: 0.33 mg/L FEU (ref 0.00–0.49)

## 2021-09-13 LAB — NOVEL CORONAVIRUS, NAA: SARS-CoV-2, NAA: NOT DETECTED

## 2021-09-13 MED ORDER — LEVOTHYROXINE SODIUM 100 MCG PO TABS
100.0000 ug | ORAL_TABLET | Freq: Every day | ORAL | 3 refills | Status: DC
Start: 2021-09-13 — End: 2021-12-25

## 2021-09-13 NOTE — Addendum Note (Signed)
Addended by: Sonny Masters on: 09/13/2021 07:53 AM   Modules accepted: Orders

## 2021-11-12 ENCOUNTER — Other Ambulatory Visit: Payer: Self-pay | Admitting: Family Medicine

## 2021-11-12 DIAGNOSIS — E785 Hyperlipidemia, unspecified: Secondary | ICD-10-CM

## 2021-12-20 ENCOUNTER — Telehealth: Payer: Self-pay

## 2021-12-20 MED ORDER — AMLODIPINE BESY-BENAZEPRIL HCL 5-20 MG PO CAPS: ORAL_CAPSULE | ORAL | 0 refills | Status: DC

## 2021-12-20 NOTE — Telephone Encounter (Signed)
Patient has appointment with Dr Nadine Counts on 12/23/21 at 3:00 pm for med refill.  He is out of BP medication and needs refills sent to [facility] until he can be seen in three days.  Refill sent to pharmacy. ?

## 2021-12-22 ENCOUNTER — Ambulatory Visit: Payer: BC Managed Care – PPO | Admitting: Family Medicine

## 2021-12-22 ENCOUNTER — Encounter: Payer: Self-pay | Admitting: Family Medicine

## 2021-12-22 VITALS — BP 123/78 | HR 88 | Temp 97.7°F | Ht 68.0 in | Wt 286.0 lb

## 2021-12-22 DIAGNOSIS — N529 Male erectile dysfunction, unspecified: Secondary | ICD-10-CM

## 2021-12-22 DIAGNOSIS — E034 Atrophy of thyroid (acquired): Secondary | ICD-10-CM

## 2021-12-22 DIAGNOSIS — I10 Essential (primary) hypertension: Secondary | ICD-10-CM

## 2021-12-22 DIAGNOSIS — E7800 Pure hypercholesterolemia, unspecified: Secondary | ICD-10-CM

## 2021-12-22 DIAGNOSIS — M10072 Idiopathic gout, left ankle and foot: Secondary | ICD-10-CM

## 2021-12-22 DIAGNOSIS — Z130 Encounter for screening for diseases of the blood and blood-forming organs and certain disorders involving the immune mechanism: Secondary | ICD-10-CM | POA: Diagnosis not present

## 2021-12-22 MED ORDER — AMLODIPINE BESY-BENAZEPRIL HCL 5-20 MG PO CAPS: ORAL_CAPSULE | ORAL | 3 refills | Status: DC

## 2021-12-22 MED ORDER — ATORVASTATIN CALCIUM 40 MG PO TABS: ORAL_TABLET | ORAL | 3 refills | Status: DC

## 2021-12-22 MED ORDER — COLCHICINE 0.6 MG PO TABS: ORAL_TABLET | ORAL | 0 refills | Status: DC

## 2021-12-22 NOTE — Progress Notes (Signed)
? ?Subjective: ?CC: Hypertension with hyperlipidemia, hypothyroidism ?PCP: Janora Norlander, DO ?YHC:WCBJSEG Noon is a 51 y.o. male presenting to clinic today for: ? ?1.  Hypertension with hyperlipidemia ?Patient is compliant with Lotrel 5-20 milligram, Lipitor 40 mg.  No chest pain, shortness of breath, dizziness reported.  No edema ? ?2.  Hypothyroidism ?Patient is compliant with Synthroid 100 mcg daily.  No reports of tremor, heart palpitations, change in weight or bowel habits ? ?3.  Gout flare ?Patient reports that he had a swollen, red, warm left great toe that seems consistent with gout.  He has been using tart cherry juice and this seems to be helping some but he still has some aches in that toe ? ? ?ROS: Per HPI ? ?Allergies  ?Allergen Reactions  ? Ibuprofen Swelling  ? ?Past Medical History:  ?Diagnosis Date  ? Clotting disorder ([place])   ? Hyperlipidemia   ? Hypertension   ? ? ?Current Outpatient Medications:  ?  amLODipine-benazepril (LOTREL) 5-20 MG capsule, TAKE 1 CAPSULE BY MOUTH EVERY DAY  (NEEDS TO BE SEEN BEFORE NEXT REFILL), Disp: 30 capsule, Rfl: 0 ?  atorvastatin (LIPITOR) 40 MG tablet, TAKE 1 TABLET (40 MG TOTAL) BY MOUTH DAILY., Disp: 90 tablet, Rfl: 3 ?  levothyroxine (SYNTHROID) 100 MCG tablet, Take 1 tablet (100 mcg total) by mouth daily., Disp: 90 tablet, Rfl: 3 ?  sildenafil (REVATIO) 20 MG tablet, TAKE 2-5 TABLETS BY MOUTH AT ONCE, WITH EACH SEXUAL ENCOUNTER, Disp: 150 tablet, Rfl: 1 ?Social History  ? ?Socioeconomic History  ? Marital status: Married  ?  Spouse name: Not on file  ? Number of children: Not on file  ? Years of education: Not on file  ? Highest education level: Not on file  ?Occupational History  ? Not on file  ?Tobacco Use  ? Smoking status: Never  ? Smokeless tobacco: Current  ?  Types: Snuff  ?Vaping Use  ? Vaping Use: Never used  ?Substance and Sexual Activity  ? Alcohol use: No  ?  Comment: [place]  ? Drug use: No  ? Sexual activity: Not on file  ?Other Topics  Concern  ? Not on file  ?Social History Narrative  ? Not on file  ? ?Social Determinants of Health  ? ?Financial Resource Strain: Not on file  ?Food Insecurity: Not on file  ?Transportation Needs: Not on file  ?Physical Activity: Not on file  ?Stress: Not on file  ?Social Connections: Not on file  ?Intimate Partner Violence: Not on file  ? ?Family History  ?Problem Relation Age of Onset  ? Cancer Mother   ?     breast cancer  ? Hypertension Mother   ? Heart attack Father   ? Diabetes Father   ? Hypertension Father   ? ? ?Objective: ?Office vital signs reviewed. ?BP 123/78   Pulse 88   Temp 97.7 ?F (36.5 ?C)   Ht '5\' 8"'  (1.727 m)   Wt 286 lb (129.7 kg)   SpO2 95%   BMI 43.49 kg/m?  ? ?Physical Examination:  ?General: Awake, alert, morbidly obese, No acute distress ?HEENT: Sclera white.  No exophthalmos or goiter ?Cardio: regular rate and rhythm, S1S2 heard, no murmurs appreciated ?Pulm: clear to auscultation bilaterally, no wheezes, rhonchi or rales; normal work of breathing on room air ?Skin: Clammy ?Extremities: Mild erythema of the left great toe but no significant warmth or joint swelling ?Neuro: No tremor ? ?Assessment/ Plan: ?51 y.o. male  ? ?Hypothyroidism due to acquired  atrophy of thyroid - Plan: TSH, T4, Free ? ?Essential hypertension - Plan: LDL Cholesterol, Direct, CMP14+EGFR, amLODipine-benazepril (LOTREL) 5-20 MG capsule ? ?Erectile dysfunction, unspecified erectile dysfunction type - Plan: PSA ? ?Pure hypercholesterolemia - Plan: LDL Cholesterol, Direct, atorvastatin (LIPITOR) 40 MG tablet ? ?Screening, anemia, deficiency, iron - Plan: CBC ? ?Acute idiopathic gout involving toe of left foot - Plan: colchicine 0.6 MG tablet ? ?Asymptomatic from a thyroid standpoint.  Check TSH, free T4 ? ?Blood pressure remains under excellent control.  Continue current regimen.  Refill has been sent.  Check direct LDL given nonfasting status and CMP ? ?Check PSA. ? ?Continue statin.  Nonfasting direct LDL  ordered ? ?Screen for deficiency anemia ? ?Colchicine sent for gout flare as needed ? ?Recommend follow-up in 6 months for annual physical exam and fasting labs ? ?No orders of the defined types were placed in this encounter. ? ?No orders of the defined types were placed in this encounter. ? ? ?Janora Norlander, DO ?[place] ?[phone] ? ?

## 2021-12-23 LAB — CBC
Hematocrit: 45.4 % (ref 37.5–51.0)
Hemoglobin: 14.9 g/dL (ref 13.0–17.7)
MCH: 28.6 pg (ref 26.6–33.0)
MCHC: 32.8 g/dL (ref 31.5–35.7)
MCV: 87 fL (ref 79–97)
Platelets: 224 10*3/uL (ref 150–450)
RBC: 5.21 x10E6/uL (ref 4.14–5.80)
RDW: 14.6 % (ref 11.6–15.4)
WBC: 9 10*3/uL (ref 3.4–10.8)

## 2021-12-23 LAB — CMP14+EGFR
ALT: 21 IU/L (ref 0–44)
AST: 16 IU/L (ref 0–40)
Albumin/Globulin Ratio: 1.4 (ref 1.2–2.2)
Albumin: 4 g/dL (ref 3.8–4.9)
Alkaline Phosphatase: 57 IU/L (ref 44–121)
BUN/Creatinine Ratio: 11 (ref 9–20)
BUN: 15 mg/dL (ref 6–24)
Bilirubin Total: 0.3 mg/dL (ref 0.0–1.2)
CO2: 23 mmol/L (ref 20–29)
Calcium: 9.3 mg/dL (ref 8.7–10.2)
Chloride: 104 mmol/L (ref 96–106)
Creatinine, Ser: 1.35 mg/dL — ABNORMAL HIGH (ref 0.76–1.27)
Globulin, Total: 2.9 g/dL (ref 1.5–4.5)
Glucose: 81 mg/dL (ref 70–99)
Potassium: 4.3 mmol/L (ref 3.5–5.2)
Sodium: 142 mmol/L (ref 134–144)
Total Protein: 6.9 g/dL (ref 6.0–8.5)
eGFR: 64 mL/min/{1.73_m2} (ref >59)

## 2021-12-23 LAB — T4, FREE: Free T4: 0.75 ng/dL — ABNORMAL LOW (ref 0.82–1.77)

## 2021-12-23 LAB — TSH: TSH: 5.86 u[IU]/mL — ABNORMAL HIGH (ref 0.450–4.500)

## 2021-12-23 LAB — LDL CHOLESTEROL, DIRECT: LDL Direct: 110 mg/dL — ABNORMAL HIGH (ref 0–99)

## 2021-12-23 LAB — PSA: Prostate Specific Ag, Serum: 0.7 ng/mL (ref 0.0–4.0)

## 2021-12-25 MED ORDER — LEVOTHYROXINE SODIUM 112 MCG PO TABS: 112.0000 ug | ORAL_TABLET | Freq: Every day | ORAL | 3 refills | Status: DC

## 2021-12-25 NOTE — Addendum Note (Signed)
Addended by: Raliegh Ip on: 12/25/2021 03:46 PM ? ? Modules accepted: Orders ? ?

## 2022-03-16 ENCOUNTER — Other Ambulatory Visit: Payer: Self-pay | Admitting: Family Medicine

## 2022-03-16 ENCOUNTER — Encounter: Payer: Self-pay | Admitting: Family Medicine

## 2022-03-16 DIAGNOSIS — M10072 Idiopathic gout, left ankle and foot: Secondary | ICD-10-CM

## 2022-03-16 NOTE — Telephone Encounter (Signed)
LMTCB TO SCHEDULE APPT LETTER MAILED 

## 2022-05-21 ENCOUNTER — Encounter: Payer: Self-pay | Admitting: Family Medicine

## 2022-05-21 ENCOUNTER — Ambulatory Visit (INDEPENDENT_AMBULATORY_CARE_PROVIDER_SITE_OTHER): Payer: BC Managed Care – PPO | Admitting: Family Medicine

## 2022-05-21 VITALS — BP 121/80 | HR 85 | Temp 97.4°F | Ht 68.0 in | Wt 289.4 lb

## 2022-05-21 DIAGNOSIS — I10 Essential (primary) hypertension: Secondary | ICD-10-CM | POA: Diagnosis not present

## 2022-05-21 DIAGNOSIS — E034 Atrophy of thyroid (acquired): Secondary | ICD-10-CM

## 2022-05-21 DIAGNOSIS — E7800 Pure hypercholesterolemia, unspecified: Secondary | ICD-10-CM | POA: Insufficient documentation

## 2022-05-21 NOTE — Progress Notes (Signed)
Subjective: CC: Hypothyroidism PCP: Raliegh Ip, DO GDJ:MEQASTM Conde is a 51 y.o. male presenting to clinic today for:  1.  Hypothyroidism Patient was noted to have abnormal thyroid levels at last visit.  His Synthroid was increased to 112 mcg daily.  He denies any concerning side effects of the medication adjustment.  Denies any changes in energy, difficulty swallowing, tremor, heart palpitations or changes in bowel habits.  2.  Morbid obesity Patient has tried diet modification and notes that exercise is limited due to work hours but that he has tried to incorporate more of this when he can.  He suffers from hypertension and hyperlipidemia and his father suffered multiple MIs.  He worries about his cardiovascular risk given his weight and other comorbidities and wants to consider starting some type of medication for weight loss.  No personal or family history of medullary thyroid cancer or multiple endocrine type II neoplasia.  He has never been treated with phentermine.   ROS: Per HPI  Allergies  Allergen Reactions   Ibuprofen Swelling   Past Medical History:  Diagnosis Date   Clotting disorder ([facility])    Hyperlipidemia    Hypertension     Current Outpatient Medications:    amLODipine-benazepril (LOTREL) 5-20 MG capsule, TAKE 1 CAPSULE BY MOUTH EVERY DAY, Disp: 90 capsule, Rfl: 3   atorvastatin (LIPITOR) 40 MG tablet, TAKE 1 TABLET (40 MG TOTAL) BY MOUTH DAILY., Disp: 90 tablet, Rfl: 3   colchicine 0.6 MG tablet, Take 2 tablets on Day #1 of gout flare. May repeat 1 tablet in 1 hour if needed.  Starting Day#2 of flare, 1 tablet daily until gout flare resolve., Disp: 90 tablet, Rfl: 0   levothyroxine (SYNTHROID) 112 MCG tablet, Take 1 tablet (112 mcg total) by mouth daily., Disp: 90 tablet, Rfl: 3   sildenafil (REVATIO) 20 MG tablet, TAKE 2-5 TABLETS BY MOUTH AT ONCE, WITH EACH SEXUAL ENCOUNTER, Disp: 150 tablet, Rfl: 1 Social History   Socioeconomic History   Marital  status: Married    Spouse name: Not on file   Number of children: Not on file   Years of education: Not on file   Highest education level: Not on file  Occupational History   Not on file  Tobacco Use   Smoking status: Never   Smokeless tobacco: Current    Types: Snuff  Vaping Use   Vaping Use: Never used  Substance and Sexual Activity   Alcohol use: No    Comment: 0CC   Drug use: No   Sexual activity: Not on file  Other Topics Concern   Not on file  Social History Narrative   Not on file   Social Determinants of Health   Financial Resource Strain: Not on file  Food Insecurity: Not on file  Transportation Needs: Not on file  Physical Activity: Not on file  Stress: Not on file  Social Connections: Not on file  Intimate Partner Violence: Not on file   Family History  Problem Relation Age of Onset   Cancer Mother        breast cancer   Hypertension Mother    Heart attack Father    Diabetes Father    Hypertension Father     Objective: Office vital signs reviewed. BP 121/80   Pulse 85   Temp (!) 97.4 F (36.3 C)   Ht 5\' 8"  (1.727 m)   Wt 289 lb 6.4 oz (131.3 kg)   SpO2 96%   BMI 44.00 kg/m  Physical Examination:  General: Awake, alert, morbidly obese, No acute distress HEENT: No exophthalmos.  No goiter Cardio: regular rate and rhythm  Pulm:  normal work of breathing on room air MSK: Eating independently with normal gait and station  Assessment/ Plan: 51 y.o. male   Hypothyroidism due to acquired atrophy of thyroid - Plan: TSH, T4, Free  Morbid obesity ([facility]CC)  Essential hypertension  Pure hypercholesterolemia  Asymptomatic from a thyroid standpoint.  Check thyroid levels  BMI consistent with morbid obesity.  Has hypertension and hyperlipidemia associated.  Continue Lotrel for blood pressure control which was controlled today.  Not treated with a statin.  Family history is significant for cardiovascular disease and he is very interested in pursuing  GLP for treatment of obesity.  No apparent contraindications to use.  He will check with his insurance company and get back to me as to whether or not this needs to be prescribed.  Discussed possible side effects and ways to avoid unwanted GI side effects with this class of medication  No orders of the defined types were placed in this encounter.  No orders of the defined types were placed in this encounter.    Raliegh Ip, DO [facility] [phone]

## 2022-05-22 LAB — TSH: TSH: 2.51 u[IU]/mL (ref 0.450–4.500)

## 2022-05-22 LAB — T4, FREE: Free T4: 1.19 ng/dL (ref 0.82–1.77)

## 2022-06-12 IMAGING — DX DG KNEE 1-2V*R*
2 series · 2 of 2 positions shown · non-contrast
Comparison: April 30, 2019

CLINICAL DATA: RIGHT knee pain

EXAM:
RIGHT KNEE - 1-2 VIEW

[knee ap]
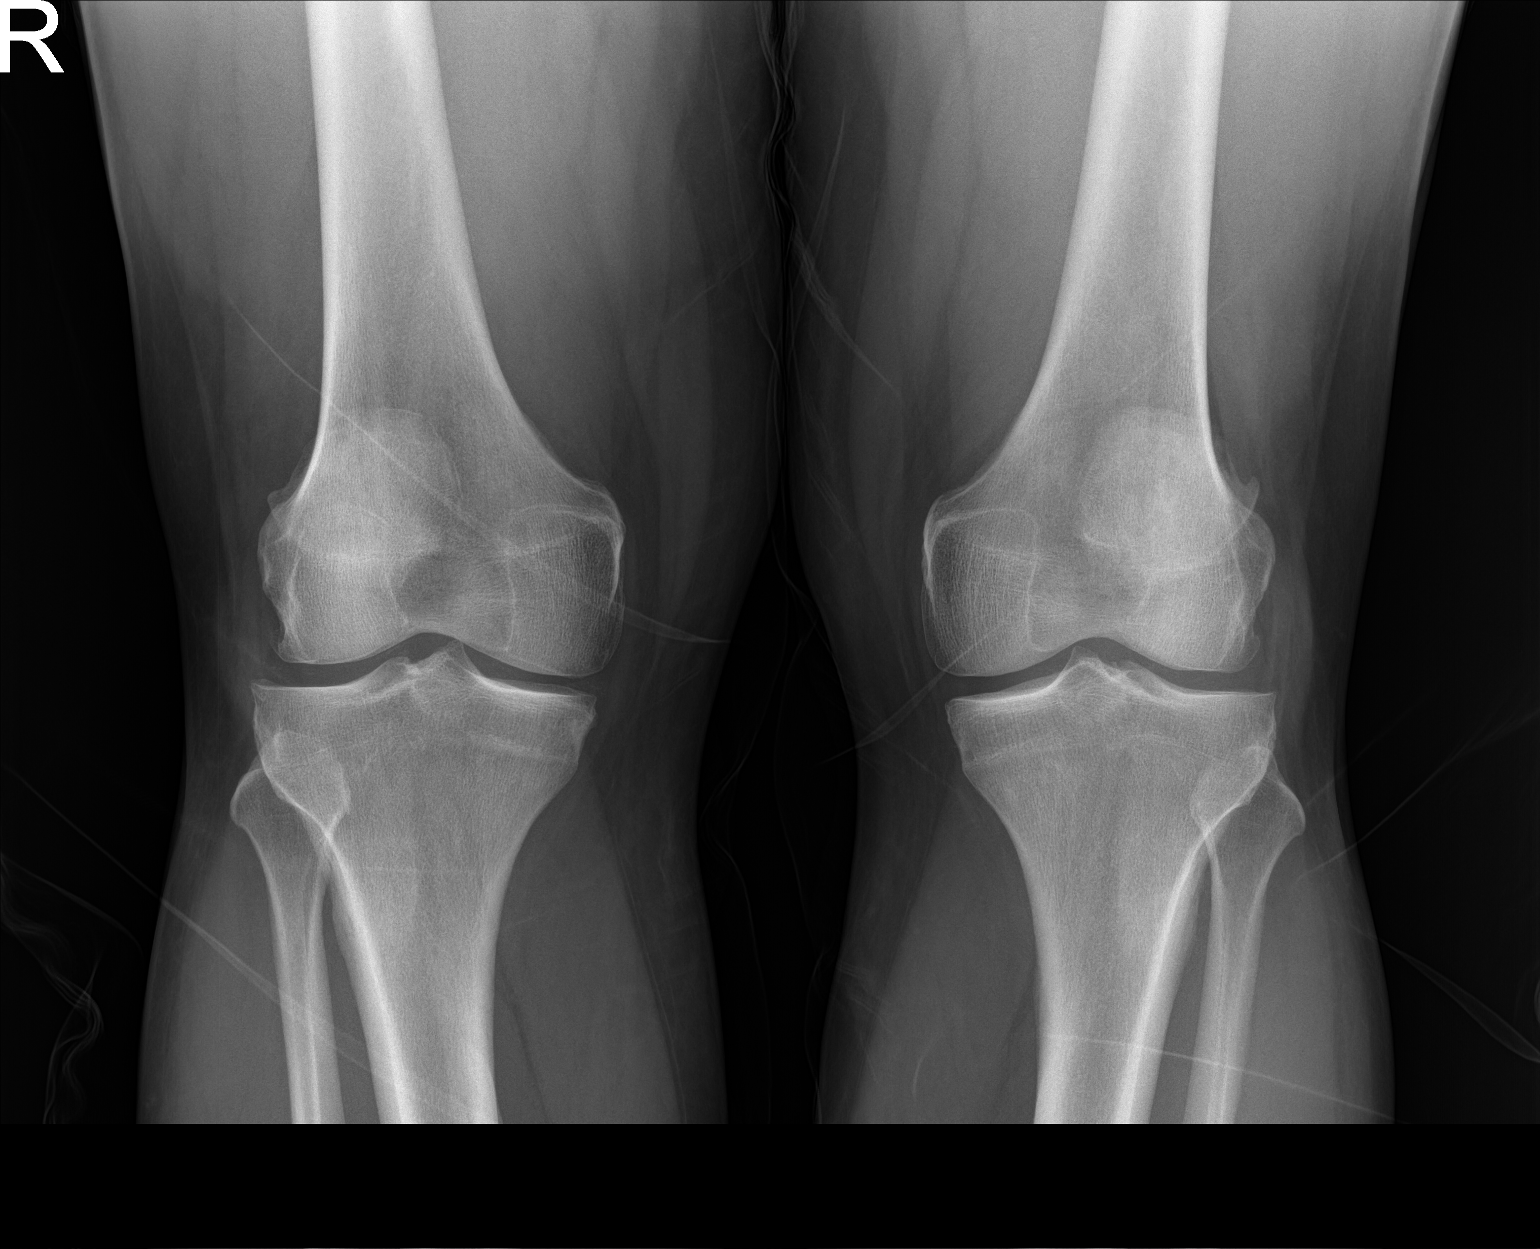

[knee lat]
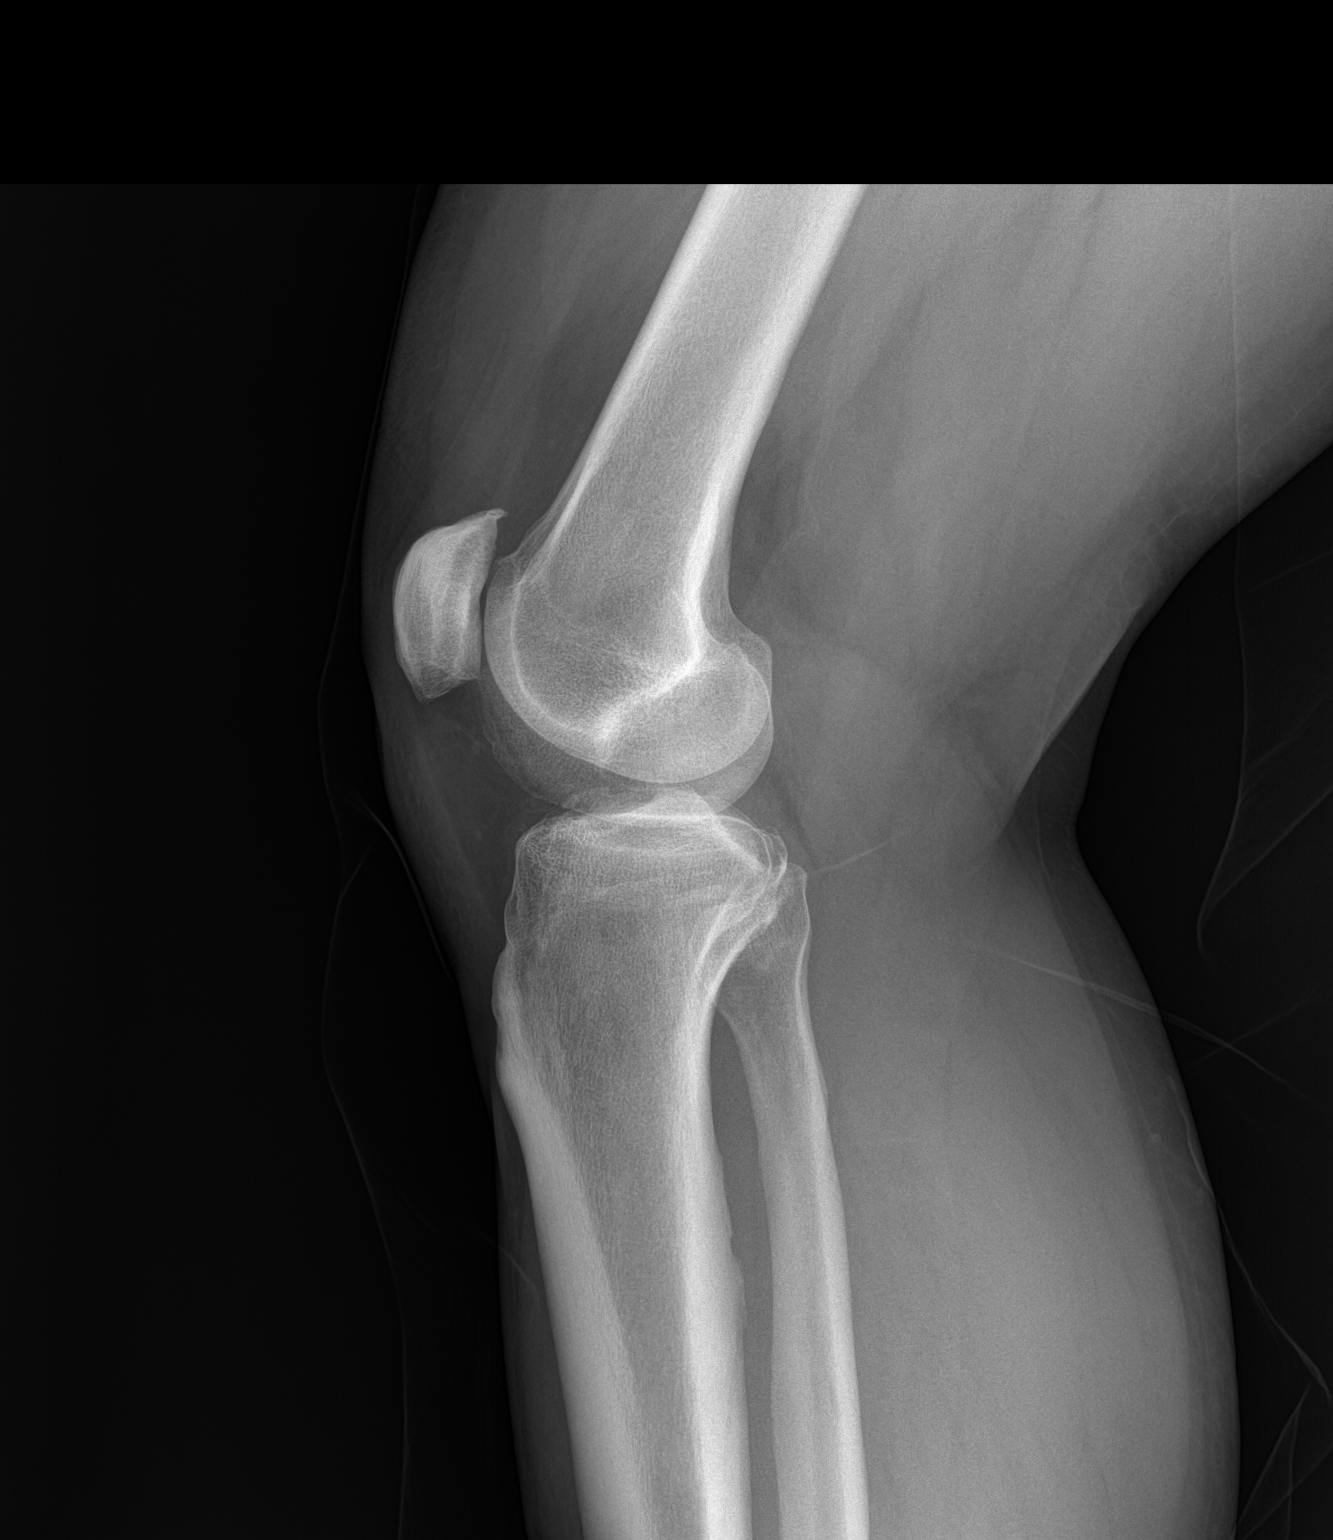

[2 of 2 positions shown; findings below may reference images not displayed]

FINDINGS: No acute fracture or dislocation. Mild tricompartmental degenerative
changes most pronounced in the lateral compartment. No area of
erosion or osseous destruction. No unexpected radiopaque foreign
body. Soft tissues are unremarkable.
IMPRESSION: Mild tricompartmental degenerative changes, most pronounced in the
lateral compartment.

## 2022-07-17 ENCOUNTER — Telehealth: Payer: Self-pay | Admitting: Family Medicine

## 2022-07-17 NOTE — Telephone Encounter (Signed)
PT AWARE OF RECOMMENDATIONS  

## 2022-07-17 NOTE — Telephone Encounter (Signed)
Pt called to let Dr Lajuana Ripple know that he spoke with his insurance and was told that they would cover Ozempic or Wegovy but requires PA.

## 2022-07-17 NOTE — Telephone Encounter (Signed)
I can do wegovy if he can find it at a pharmacy, let me know which to send it to.  Will need to start with 0.25mg , then 0.5mg , then 1mg .  Ozempic is covered for diabetics only and luckily he does not have that diagnosis.

## 2022-07-17 NOTE — Telephone Encounter (Signed)
Sorry, not Ozempic. Saxenda

## 2022-07-17 NOTE — Telephone Encounter (Signed)
Is he sure they said ozempic? Please verify medication with patient- lmtcb

## 2022-11-21 ENCOUNTER — Encounter: Payer: Self-pay | Admitting: Family Medicine

## 2022-11-21 ENCOUNTER — Ambulatory Visit: Payer: BC Managed Care – PPO | Admitting: Family Medicine

## 2022-11-21 ENCOUNTER — Ambulatory Visit (INDEPENDENT_AMBULATORY_CARE_PROVIDER_SITE_OTHER): Payer: BC Managed Care – PPO

## 2022-11-21 VITALS — BP 115/75 | HR 75 | Temp 98.6°F | Ht 68.0 in | Wt 297.0 lb

## 2022-11-21 DIAGNOSIS — I10 Essential (primary) hypertension: Secondary | ICD-10-CM

## 2022-11-21 DIAGNOSIS — E034 Atrophy of thyroid (acquired): Secondary | ICD-10-CM

## 2022-11-21 DIAGNOSIS — M10072 Idiopathic gout, left ankle and foot: Secondary | ICD-10-CM

## 2022-11-21 DIAGNOSIS — E7800 Pure hypercholesterolemia, unspecified: Secondary | ICD-10-CM | POA: Diagnosis not present

## 2022-11-21 DIAGNOSIS — M25562 Pain in left knee: Secondary | ICD-10-CM | POA: Diagnosis not present

## 2022-11-21 MED ORDER — LEVOTHYROXINE SODIUM 112 MCG PO TABS: 112.0000 ug | ORAL_TABLET | Freq: Every day | ORAL | 3 refills | Status: DC

## 2022-11-21 MED ORDER — HYDROCODONE-ACETAMINOPHEN 5-325 MG PO TABS: 1.0000 | ORAL_TABLET | Freq: Four times a day (QID) | ORAL | 0 refills | Status: DC | PRN

## 2022-11-21 MED ORDER — ATORVASTATIN CALCIUM 40 MG PO TABS: ORAL_TABLET | ORAL | 3 refills | Status: DC

## 2022-11-21 MED ORDER — COLCHICINE 0.6 MG PO TABS: ORAL_TABLET | ORAL | 0 refills | Status: DC

## 2022-11-21 MED ORDER — SILDENAFIL CITRATE 20 MG PO TABS: ORAL_TABLET | ORAL | 1 refills | Status: DC

## 2022-11-21 MED ORDER — AMLODIPINE BESY-BENAZEPRIL HCL 5-20 MG PO CAPS: ORAL_CAPSULE | ORAL | 3 refills | Status: DC

## 2022-11-21 NOTE — Progress Notes (Signed)
Subjective: CC: knee pain PCP: Janora Norlander, DO ZB:[id number] [name] [name] is a 52 y.o. male presenting to clinic today for:  1.  Left knee pain Patient reports several day history of left-sided knee pain.  He was standing in the back of a truck on Monday when he barely turned and felt a pop along the medial aspect of his knee and has subsequent pain and swelling.  The swelling is slightly better but continues to have pain.  He cannot really move the knee very well but is ambulating independently.  Has been using ice, Tylenol and a brace in efforts to alleviate some of the discomfort.  Cannot take NSAIDs secondary to history of anaphylaxis to ibuprofen.  2.  Hypothyroidism Patient is compliant with thyroid replacement medication.  No tremor, heart palpitations or changes in bowel habits.  3.  Hypertension associate with hyperlipidemia.   Compliant with medications.  No chest pain, shortness of breath or dizziness.  Swelling of the lower extremity due to above present   ROS: Per HPI  Allergies  Allergen Reactions   Ibuprofen Swelling   Past Medical History:  Diagnosis Date   Clotting disorder (HCC)    Hyperlipidemia    Hypertension     Current Outpatient Medications:    amLODipine-benazepril (LOTREL) 5-20 MG capsule, TAKE 1 CAPSULE BY MOUTH EVERY DAY, Disp: 90 capsule, Rfl: 3   atorvastatin (LIPITOR) 40 MG tablet, TAKE 1 TABLET (40 MG TOTAL) BY MOUTH DAILY., Disp: 90 tablet, Rfl: 3   colchicine 0.6 MG tablet, Take 2 tablets on Day #1 of gout flare. May repeat 1 tablet in 1 hour if needed.  Starting Day#2 of flare, 1 tablet daily until gout flare resolve., Disp: 90 tablet, Rfl: 0   levothyroxine (SYNTHROID) 112 MCG tablet, Take 1 tablet (112 mcg total) by mouth daily., Disp: 90 tablet, Rfl: 3   sildenafil (REVATIO) 20 MG tablet, TAKE 2-5 TABLETS BY MOUTH AT ONCE, WITH EACH SEXUAL ENCOUNTER, Disp: 150 tablet, Rfl: 1 Social History   Socioeconomic History   Marital status:  Married    Spouse name: Not on file   Number of children: Not on file   Years of education: Not on file   Highest education level: Not on file  Occupational History   Not on file  Tobacco Use   Smoking status: Never   Smokeless tobacco: Current    Types: Snuff  Vaping Use   Vaping Use: Never used  Substance and Sexual Activity   Alcohol use: No    Comment: [place]   Drug use: No   Sexual activity: Not on file  Other Topics Concern   Not on file  Social History Narrative   Not on file   Social Determinants of Health   Financial Resource Strain: Not on file  Food Insecurity: Not on file  Transportation Needs: Not on file  Physical Activity: Not on file  Stress: Not on file  Social Connections: Not on file  Intimate Partner Violence: Not on file   Family History  Problem Relation Age of Onset   Cancer Mother        breast cancer   Hypertension Mother    Heart attack Father    Diabetes Father    Hypertension Father     Objective: Office vital signs reviewed. BP 115/75   Pulse 75   Temp 98.6 F (37 C)   Ht 5' 8"$  (1.727 m)   Wt 297 lb (134.7 kg)   SpO2 97%  BMI 45.16 kg/m   Physical Examination:  General: Awake, alert, obese, No acute distress HEENT: sclera white, MMM.  No exophthalmos.  No goiter Cardio: regular rate and rhythm, S1S2 heard, no murmurs appreciated Pulm: clear to auscultation bilaterally, no wheezes, rhonchi or rales; normal work of breathing on room air Extremities: warm, well perfused, No edema, cyanosis or clubbing; +2 pulses bilaterally MSK: Antalgic gait and station.  Left knee: Has about a 10 degree reduction in extension of the knee.  Cannot flex past 90 degrees.  He has exquisite tenderness palpation along the medial knee but no pain along the joint line, patella.  No pain to the posterior popliteal fossa.  Assessment/ Plan: 52 y.o. male   Hypothyroidism due to acquired atrophy of thyroid - Plan: TSH, T4, Free, levothyroxine  (SYNTHROID) 112 MCG tablet  Medial knee pain, left - Plan: DG Knee 1-2 Views Left, Ambulatory referral to Sports Medicine, HYDROcodone-acetaminophen ([facility]) 5-325 MG tablet  Essential hypertension - Plan: amLODipine-benazepril (LOTREL) 5-20 MG capsule  Pure hypercholesterolemia - Plan: atorvastatin (LIPITOR) 40 MG tablet  Acute idiopathic gout involving toe of left foot - Plan: colchicine 0.6 MG tablet  Check thyroid levels.  Thyroid med renewed  ?  Soft tissue injury.  He cannot take oral NSAIDs which is quite unfortunate.  I was hesitant to give him any steroid in case this was felt to be appropriate intra-articularly when he sees sports medicine.  So I am going to place him on a small dose of Norco to have on hand for severe pain.  Continue icing the affected area, compression.  Plain films were obtained in anticipation of his sports medicine evaluation.  I have communicated my concerns to the sports medicine provider and hopefully they can get to the bottom of what type of injury he sustained.  His care is obviously complicated by the history of patellar tendon surgery in 2003.  Blood pressure is controlled.  He will come in for fasting cholesterol at his physical.  Medications have been renewed No orders of the defined types were placed in this encounter.  No orders of the defined types were placed in this encounter.    Janora Norlander, DO [place] [phone]

## 2022-11-22 LAB — T4, FREE: Free T4: 0.58 ng/dL — ABNORMAL LOW (ref 0.82–1.77)

## 2022-11-22 LAB — TSH: TSH: 20.8 u[IU]/mL — ABNORMAL HIGH (ref 0.450–4.500)

## 2022-11-26 ENCOUNTER — Encounter: Payer: Self-pay | Admitting: Family Medicine

## 2022-11-26 ENCOUNTER — Ambulatory Visit: Payer: Self-pay

## 2022-11-26 ENCOUNTER — Ambulatory Visit: Payer: BC Managed Care – PPO | Admitting: Family Medicine

## 2022-11-26 VITALS — BP 128/86 | Ht 68.0 in | Wt 285.0 lb

## 2022-11-26 DIAGNOSIS — M25562 Pain in left knee: Secondary | ICD-10-CM

## 2022-11-26 DIAGNOSIS — M23204 Derangement of unspecified medial meniscus due to old tear or injury, left knee: Secondary | ICD-10-CM | POA: Diagnosis not present

## 2022-11-26 DIAGNOSIS — M179 Osteoarthritis of knee, unspecified: Secondary | ICD-10-CM | POA: Insufficient documentation

## 2022-11-26 MED ORDER — TRIAMCINOLONE ACETONIDE 40 MG/ML IJ SUSP
40.0000 mg | Freq: Once | INTRAMUSCULAR | Status: AC
  Administered 2022-11-26: 40 mg via INTRA_ARTICULAR

## 2022-11-26 NOTE — Progress Notes (Signed)
  [name] [name] - 52 y.o. male MRN [id number]  Date of birth: 29-Sep-1971  SUBJECTIVE:  Including CC & ROS.  No chief complaint on file.   [name] [name] is a 52 y.o. male that is presenting with acute left knee pain.  He felt a pop on the medial aspect of his knee.  Having significant pain over the medial compartment.  He has a history of patellar tendon rupture in the same knee.  Has had steroid injections as well..   Independent review of the left knee x-ray shows degenerative changes in the medial compartment.  Review of Systems See HPI   HISTORY: Past Medical, Surgical, Social, and Family History Reviewed & Updated per EMR.   Pertinent Historical Findings include:  Past Medical History:  Diagnosis Date   Clotting disorder ([place])    Hyperlipidemia    Hypertension     Past Surgical History:  Procedure Laterality Date   BICEPS TENDON REPAIR     KNEE SURGERY Left 2003     PHYSICAL EXAM:  VS: BP 128/86   Ht 5' 8"$  (1.727 m)   Wt 285 lb (129.3 kg)   BMI 43.33 kg/m  Physical Exam Gen: NAD, alert, cooperative with exam, well-appearing MSK:  Neurovascularly intact    Limited ultrasound: Left knee pain:  Mild to moderate effusion within the suprapatellar pouch. Thickening of the quadriceps tendon. Significant thickening of the patellar tendon with hyperechoic suture placement. Increased hyperemia over the medial meniscus with hypoechoic changes in the mid body of the pes anserine tendons.   Summary: Findings consistent with irritation of the medial meniscus with soft tissue swelling as well.  Ultrasound and interpretation by Clearance Coots, MD  Aspiration/Injection Procedure Note [name] 1971-05-11  Procedure: Injection and aspiration Indications: left knee pain  Procedure Details Consent: Risks of procedure as well as the alternatives and risks of each were explained to the (patient/caregiver).  Consent for procedure obtained. Time Out: Verified patient  identification, verified procedure, site/side was marked, verified correct patient position, special equipment/implants available, medications/allergies/relevent history reviewed, required imaging and test results available.  Performed.  The area was cleaned with iodine and alcohol swabs.    The left knee superior lateral suprapatellar pouch was injected using 1 cc of 1% lidocaine on a 22-gauge 1-1/2 inch needle.  An 18-gauge 1-1/2 inch needle was used to achieve aspiration.  The syringe was switched to mixture containing 1 cc's of 40 mg Kenalog and 4 cc's of 0.25% bupivacaine was injected.  Ultrasound was used. Images were obtained in long views showing the injection.    Amount of Fluid Aspirated: 56m Character of Fluid: clear and straw colored Fluid was sent for: n/a A sterile dressing was applied.  Patient did tolerate procedure well.     ASSESSMENT & PLAN:   Degenerative tear of medial meniscus of left knee Acutely occurring with the knee effusion as well as hyperemia surrounding the medial meniscus.  Does have overlying soft tissue swelling as well which seems to involve more of the tendons involved in the pans anserine.  -Counseled on home exercise therapy and supportive care. -Aspiration and injection. -Counseled on topical Voltaren. -Counseled on compression. -Could consider physical therapy

## 2022-11-26 NOTE — Patient Instructions (Signed)
Nice to meet you Please use ice  Please try the exercises  You can try voltaren over the counter gel in a small area to make sure you don't have a reaction  You can consider compression   Please send me a message in [place] with any questions or updates.  Please see me back in 2-3 weeks.   --Dr. Raeford Razor

## 2022-11-26 NOTE — Assessment & Plan Note (Signed)
Acutely occurring with the knee effusion as well as hyperemia surrounding the medial meniscus.  Does have overlying soft tissue swelling as well which seems to involve more of the tendons involved in the pans anserine.  -Counseled on home exercise therapy and supportive care. -Aspiration and injection. -Counseled on topical Voltaren. -Counseled on compression. -Could consider physical therapy

## 2022-11-26 NOTE — Addendum Note (Signed)
Addended by: Marva Panda on: 11/26/2022 09:19 AM   Modules accepted: Orders

## 2022-12-17 ENCOUNTER — Ambulatory Visit (HOSPITAL_BASED_OUTPATIENT_CLINIC_OR_DEPARTMENT_OTHER)
Admission: RE | Admit: 2022-12-17 | Discharge: 2022-12-17 | Disposition: A | Discharge status: Home / Self Care | Payer: BC Managed Care – PPO | Source: Ambulatory Visit | Attending: Family Medicine | Admitting: Family Medicine

## 2022-12-17 ENCOUNTER — Telehealth: Payer: Self-pay | Admitting: Family Medicine

## 2022-12-17 ENCOUNTER — Ambulatory Visit: Payer: BC Managed Care – PPO | Admitting: Family Medicine

## 2022-12-17 ENCOUNTER — Ambulatory Visit: Payer: Self-pay

## 2022-12-17 VITALS — BP 120/80 | Ht 68.0 in | Wt 280.0 lb

## 2022-12-17 DIAGNOSIS — M7989 Other specified soft tissue disorders: Secondary | ICD-10-CM | POA: Insufficient documentation

## 2022-12-17 DIAGNOSIS — M659 Synovitis and tenosynovitis, unspecified: Secondary | ICD-10-CM

## 2022-12-17 DIAGNOSIS — M65962 Unspecified synovitis and tenosynovitis, left lower leg: Secondary | ICD-10-CM | POA: Insufficient documentation

## 2022-12-17 NOTE — Assessment & Plan Note (Signed)
Continues to have left thigh swelling when compared to contralateral side.  He does have a history of pulmonary embolism with possible covd as a source.  - counseled on supportive care - check duplex

## 2022-12-17 NOTE — Telephone Encounter (Signed)
Left VM for patient. If he calls back please have him speak with a nurse/CMA and inform that his ultrasound was normal.    If any questions then please take the best time and phone number to call and I will try to call him back.   Rosemarie Ax, MD [facility] 12/17/2022, 4:50 PM

## 2022-12-17 NOTE — Assessment & Plan Note (Signed)
Acutely occurring.  His knee pain has improved but still has significant pain in the medial compartment of the thigh.  He does report a history of gout that could be playing a role.  Seems less likely for infection. -Counseled on home exercise therapy and supportive care. -Uric acid, ANA, sed rate, CRP, CK -Counseled on initiating colchicine.

## 2022-12-17 NOTE — Progress Notes (Signed)
  [name] - 52 y.o. male MRN [id number]  Date of birth: 20-Feb-1971  SUBJECTIVE:  Including CC & ROS.  No chief complaint on file.   [name] [name] is a 52 y.o. male that is presenting with continued left medial thigh pain.  The pain has been ongoing for a few weeks and having redness and swelling of the area.  No improvement with intra-articular knee injection.  Pain is localized and severe.  He did fill an initial "pop" when it initiated.    Review of Systems See HPI   HISTORY: Past Medical, Surgical, Social, and Family History Reviewed & Updated per EMR.   Pertinent Historical Findings include:  Past Medical History:  Diagnosis Date   Clotting disorder ([place])    Hyperlipidemia    Hypertension     Past Surgical History:  Procedure Laterality Date   BICEPS TENDON REPAIR     KNEE SURGERY Left 2003     PHYSICAL EXAM:  VS: BP 120/80   Ht 5\' 8"  (1.727 m)   Wt 280 lb (127 kg)   BMI 42.57 kg/m  Physical Exam Gen: NAD, alert, cooperative with exam, well-appearing MSK:  Neurovascularly intact    Limited ultrasound: Left leg pain:  No effusion the suprapatellar pouch. There is hyperemia in the overlying soft tissue and in the fascia with a on the medial border the vastus medialis in the medial compartment of the thigh.   No localized abscess.   Summary: Findings consistent with inflammation in the area  Ultrasound and interpretation by Clearance Coots, MD    ASSESSMENT & PLAN:   Tenosynovitis of left lower leg Acutely occurring.  His knee pain has improved but still has significant pain in the medial compartment of the thigh.  He does report a history of gout that could be playing a role.  Seems less likely for infection. -Counseled on home exercise therapy and supportive care. -Uric acid, ANA, sed rate, CRP, CK -Counseled on initiating colchicine.  Left leg swelling Continues to have left thigh swelling when compared to contralateral side.  He does have a  history of pulmonary embolism with possible covd as a source.  - counseled on supportive care - check duplex

## 2022-12-17 NOTE — Patient Instructions (Signed)
Good to see you Please start the colchicine  We'll call with the lab and ultrasound results  Please send me a message in MyChart with any questions or updates.  Follow up will depend on results.   --Dr. Raeford Razor

## 2022-12-19 ENCOUNTER — Telehealth: Payer: Self-pay | Admitting: Family Medicine

## 2022-12-19 NOTE — Telephone Encounter (Signed)
Informed of results.   Rosemarie Ax, MD [facility] 12/19/2022, 5:11 PM

## 2022-12-20 LAB — ANA,IFA RA DIAG PNL W/RFLX TIT/PATN
ANA Titer 1: NEGATIVE
Cyclic Citrullin Peptide Ab: 9 units (ref 0–19)
Rheumatoid fact SerPl-aCnc: <10 IU/mL (ref <14.0)

## 2022-12-20 LAB — C-REACTIVE PROTEIN: CRP: 6 mg/L (ref 0–10)

## 2022-12-20 LAB — CK: Total CK: 222 U/L (ref 41–331)

## 2022-12-20 LAB — URIC ACID: Uric Acid: 9.2 mg/dL — ABNORMAL HIGH (ref 3.8–8.4)

## 2022-12-20 LAB — SEDIMENTATION RATE: Sed Rate: 28 mm/hr (ref 0–30)

## 2022-12-24 ENCOUNTER — Telehealth: Payer: Self-pay | Admitting: Family Medicine

## 2022-12-24 NOTE — Telephone Encounter (Signed)
Left VM for patient. If he calls back please have him speak with a nurse/CMA and inform that his other labs returned normal. If still having pain would consider the MRi.   If any questions then please take the best time and phone number to call and I will try to call him back.   Rosemarie Ax, MD [facility] 12/24/2022, 12:13 PM

## 2023-01-01 ENCOUNTER — Encounter: Payer: Self-pay | Admitting: Family Medicine

## 2023-01-02 ENCOUNTER — Other Ambulatory Visit: Payer: Self-pay | Admitting: Family Medicine

## 2023-01-02 DIAGNOSIS — M23204 Derangement of unspecified medial meniscus due to old tear or injury, left knee: Secondary | ICD-10-CM

## 2023-01-02 NOTE — Progress Notes (Signed)
Continues to have pain in the medial knee. Pain occurring after an initial injury. Has completed greater than 6 weeks of home exercise therapy. Positive McMurray's test with valgus and varus testing. Pursue an MRi for MCL tear and for presurgical planning.   Rosemarie Ax, MD [facility] 01/02/2023, 8:49 AM

## 2023-01-14 ENCOUNTER — Encounter: Payer: Self-pay | Admitting: Family Medicine

## 2023-01-14 ENCOUNTER — Encounter: Payer: Self-pay | Admitting: *Deleted

## 2023-01-30 ENCOUNTER — Ambulatory Visit
Admission: RE | Admit: 2023-01-30 | Discharge: 2023-01-30 | Disposition: A | Discharge status: Home / Self Care | Payer: BC Managed Care – PPO | Source: Ambulatory Visit | Attending: Family Medicine | Admitting: Family Medicine

## 2023-01-30 DIAGNOSIS — M23222 Derangement of posterior horn of medial meniscus due to old tear or injury, left knee: Secondary | ICD-10-CM | POA: Diagnosis not present

## 2023-01-30 DIAGNOSIS — M25562 Pain in left knee: Secondary | ICD-10-CM | POA: Diagnosis not present

## 2023-01-30 DIAGNOSIS — M23204 Derangement of unspecified medial meniscus due to old tear or injury, left knee: Secondary | ICD-10-CM

## 2023-02-06 ENCOUNTER — Encounter: Payer: Self-pay | Admitting: Family Medicine

## 2023-02-06 ENCOUNTER — Telehealth (INDEPENDENT_AMBULATORY_CARE_PROVIDER_SITE_OTHER): Payer: BC Managed Care – PPO | Admitting: Family Medicine

## 2023-02-06 VITALS — Ht 68.0 in | Wt 280.0 lb

## 2023-02-06 DIAGNOSIS — M1712 Unilateral primary osteoarthritis, left knee: Secondary | ICD-10-CM

## 2023-02-06 NOTE — Progress Notes (Signed)
Virtual Visit via Video Note  I connected with [name] [name] on 02/06/23 at  1:10 PM EDT by a video enabled telemedicine application and verified that I am speaking with the correct person using two identifiers.  Location: Patient: vehicle  Provider: office   I discussed the limitations of evaluation and management by telemedicine and the availability of in person appointments. The patient expressed understanding and agreed to proceed.  History of Present Illness:  [name] [name] is a 52 year old male that is following up for the MRI of his left knee.  This was demonstrating degenerative changes in the medial compartment as well as the patellofemoral joint.  There is a subchondral stress fracture involving the medial femoral condyle with marrow edema.  Has a joint effusion and Baker's cyst with extensively torn posterior horn and mid body of the medial meniscus.  Continues to have pain that keeps him up at night.  Observations/Objective:   Assessment and Plan:  OA of left knee: Acute on chronic in nature.  Continues to have significant pain with MRI demonstrating degenerative changes in the meniscus as well as the joint space with stress fracture of the medial femoral condyle. -counseled on home exercise therapy and supportive care. -Pursue gel injection. -Referral to physical therapy. -Medial unloader brace due to thigh to calf ratio. -Referral to orthopedist -Provided work note  Follow Up Instructions:    I discussed the assessment and treatment plan with the patient. The patient was provided an opportunity to ask questions and all were answered. The patient agreed with the plan and demonstrated an understanding of the instructions.   The patient was advised to call back or seek an in-person evaluation if the symptoms worsen or if the condition fails to improve as anticipated.   [name] Gandy, MD

## 2023-02-06 NOTE — Assessment & Plan Note (Signed)
Acute on chronic in nature.  Continues to have significant pain with MRI demonstrating degenerative changes in the meniscus as well as the joint space with stress fracture of the medial femoral condyle. -counseled on home exercise therapy and supportive care. -Pursue gel injection. -Referral to physical therapy. -Medial unloader brace due to thigh to calf ratio. -Referral to orthopedist -Provided work note

## 2023-02-08 ENCOUNTER — Telehealth: Payer: Self-pay | Admitting: *Deleted

## 2023-02-08 NOTE — Telephone Encounter (Signed)
Verified [employer] benefits via [employer].

## 2023-02-13 NOTE — Telephone Encounter (Signed)
Rec'd summary of benefits for [employer]. Deductible does not apply. Once $3000 OOP max is met (Met: $1217.62), coverage goes to 100%. Patient owes $75 copay. PA is required. PA form completed and faxed to [facility].

## 2023-02-16 ENCOUNTER — Other Ambulatory Visit: Payer: Self-pay | Admitting: Family Medicine

## 2023-02-16 DIAGNOSIS — M10072 Idiopathic gout, left ankle and foot: Secondary | ICD-10-CM

## 2023-02-18 DIAGNOSIS — M1712 Unilateral primary osteoarthritis, left knee: Secondary | ICD-10-CM | POA: Diagnosis not present

## 2023-02-26 ENCOUNTER — Encounter: Payer: Self-pay | Admitting: Family Medicine

## 2023-02-26 ENCOUNTER — Other Ambulatory Visit: Payer: Self-pay

## 2023-02-26 ENCOUNTER — Ambulatory Visit: Payer: BC Managed Care – PPO | Attending: Family Medicine

## 2023-02-26 DIAGNOSIS — G8929 Other chronic pain: Secondary | ICD-10-CM | POA: Insufficient documentation

## 2023-02-26 DIAGNOSIS — M25662 Stiffness of left knee, not elsewhere classified: Secondary | ICD-10-CM | POA: Insufficient documentation

## 2023-02-26 DIAGNOSIS — M25562 Pain in left knee: Secondary | ICD-10-CM | POA: Diagnosis not present

## 2023-02-26 DIAGNOSIS — M1712 Unilateral primary osteoarthritis, left knee: Secondary | ICD-10-CM | POA: Insufficient documentation

## 2023-02-26 NOTE — Therapy (Addendum)
 OUTPATIENT PHYSICAL THERAPY LOWER EXTREMITY EVALUATION   Patient Name: [name] [name] MRN: [id number] DOB:Apr 08, 1971, 52 y.o., male Today's Date: 02/26/2023  END OF SESSION:  PT End of Session - 02/26/23 1515     Visit Number 1    Number of Visits 6    Date for PT Re-Evaluation 04/26/23    PT Start Time 1521    PT Stop Time 1604    PT Time Calculation (min) 43 min    Activity Tolerance Patient tolerated treatment well    Behavior During Therapy [facility] for tasks assessed/performed             Past Medical History:  Diagnosis Date   Clotting disorder (HCC)    Hyperlipidemia    Hypertension    Past Surgical History:  Procedure Laterality Date   BICEPS TENDON REPAIR     KNEE SURGERY Left 2003   Patient Active Problem List   Diagnosis Date Noted   Tenosynovitis of left lower leg 12/17/2022   Left leg swelling 12/17/2022   OA (osteoarthritis) of knee 11/26/2022   Morbid obesity (HCC) 05/21/2022   Pure hypercholesterolemia 05/21/2022   COVID-19 vaccine series completed 05/05/2020   Pulmonary embolism (HCC) 10/21/2019   Hypothyroidism 02/18/2018   BMI 40.0-44.9, adult (HCC) 08/20/2017   Hyperlipidemia 10/19/2015   RBBB 09/20/2015   Obstructive sleep apnea 05/14/2014   Essential hypertension 04/28/2013    PCP: Raliegh Ip, DO  REFERRING PROVIDER: Myra Rude, MD   REFERRING DIAG: Primary osteoarthritis of left knee   THERAPY DIAG:  Chronic pain of left knee  Stiffness of left knee, not elsewhere classified  Rationale for Evaluation and Treatment: Rehabilitation  ONSET DATE: 5-6 months ago  SUBJECTIVE:   SUBJECTIVE STATEMENT: Patient reports that he has been having left knee pain in years, but it has been a lot worse over the past 5-6 months after twisting on his left knee. He notes that when this happened his pain shot up to a 11/10 as he could hardly move due to the pain. He has noticed that some days are better than others with no known  cause to what causes it to swell and hurt. He tried wearing a new brace today which seems to help his back feel better. He has noticed that his knee will click quite a bit, but it will start hurting soon after it does this. However, he has not noticed any catching in his knee.   PERTINENT HISTORY: Hypertension, osteoarthritis, and previous left knee surgery PAIN:  Are you having pain? Yes: NPRS scale: 8/10 Pain location: left knee Pain description: numbness and aching (medial knee)  Aggravating factors: using his knee  Relieving factors: brace  PRECAUTIONS: None  WEIGHT BEARING RESTRICTIONS: No  FALLS:  Has patient fallen in last 6 months? No  LIVING ENVIRONMENT: Lives with: lives with their family Lives in: House/apartment Stairs: Yes: Internal: 6 steps; on right going up; step to pattern Has following equipment at home: None  OCCUPATION: lineman  PLOF: Independent  PATIENT GOALS: reduced pain, improved mobility, and be able to do his daily activities  NEXT MD VISIT: none scheduled  OBJECTIVE:   DIAGNOSTIC FINDINGS: 02/04/23 left knee MRI IMPRESSION: 1. Severely degenerated and extensively torn posterior horn and mid body region of the medial meniscus. 2. Intact ligamentous structures. 3. Significant age advanced tricompartmental degenerative changes. 4. Small subchondral stress fracture involving the medial femoral condyle with surrounding marrow edema. 5. Large joint effusion and moderate-sized leaking Baker's cyst.  COGNITION:  Overall cognitive status: Within functional limits for tasks assessed     SENSATION: Patient reports intermittent numbness/tingling in his left knee, but none currently.  EDEMA:  Mild left knee edema along joint line  PALPATION: TTP: medial hamstring, medial joint line (worst pain), and popliteal fossa  JOINT MOBILITY:  Left patella: WFL and nonpainful   Left tibiofemoral: hypomobile and painful   LOWER EXTREMITY ROM:  Active ROM  Right eval Left eval  Hip flexion    Hip extension    Hip abduction    Hip adduction    Hip internal rotation    Hip external rotation    Knee flexion 124 111: "numbning feeling" and "pressure"   Knee extension 0 4; "numbning feeling" and "pressure"   Ankle dorsiflexion    Ankle plantarflexion    Ankle inversion    Ankle eversion     (Blank rows = not tested)  LOWER EXTREMITY MMT: not tested due to pain severity and irritability  LOWER EXTREMITY SPECIAL TESTS:  Knee special tests: McMurray's test: positive   GAIT: Assistive device utilized: None Level of assistance: Complete Independence Comments: LLE knee brace with ambulation   TODAY'S TREATMENT:                                                                                                                              DATE:     PATIENT EDUCATION:  Education details: prognosis, follow up with orthopedic surgeon, anatomy, and potential benefits of therapy Person educated: Patient Education method: Explanation Education comprehension: verbalized understanding  HOME EXERCISE PROGRAM:   ASSESSMENT:  CLINICAL IMPRESSION: Patient is a 52 y.o. male who was seen today for physical therapy evaluation and treatment for a tear of the left medial meniscus. He presented with high pain severity and irritability with palpation and special testing being the most aggravating to his familiar symptoms. Manual muscle testing was not assessed at this time due to his symptom severity and irritability. He may benefit from additional medical evaluation prior to continuing with skilled physical therapy.  PHYSICAL THERAPY DISCHARGE SUMMARY  Visits from Start of Care: 1  Current functional level related to goals / functional outcomes: Patient was unable to meet his goals for skilled physical therapy as he did not return following his initial evaluation.    Remaining deficits: See evaluation   Education / Equipment: See education    Patient agrees to discharge. Patient goals were not met. Patient is being discharged due to not returning since the last visit.  Candi Leash, PT, DPT    OBJECTIVE IMPAIRMENTS: decreased mobility, difficulty walking, decreased ROM, decreased strength, hypomobility, increased edema, impaired sensation, and pain.   ACTIVITY LIMITATIONS: carrying, lifting, standing, squatting, stairs, transfers, and locomotion level  PARTICIPATION LIMITATIONS: shopping, community activity, occupation, and yard work  PERSONAL FACTORS: Past/current experiences, Profession, Time since onset of injury/illness/exacerbation, and 1-2 comorbidities: Hypertension and osteoarthritis  are also affecting patient's functional outcome.   REHAB POTENTIAL:  Fair    CLINICAL DECISION MAKING: Evolving/moderate complexity  EVALUATION COMPLEXITY: Moderate   GOALS: Goals reviewed with patient? Yes  LONG TERM GOALS: Target date: 03/19/23  Patient will be independent with his HEP. Baseline:  Goal status: INITIAL  2.  Patient will be able to demonstrate at least 120 degrees of left knee flexion for improved function navigating stairs. Baseline:  Goal status: INITIAL  3.  Patient will be able to navigate at least 3 steps with a reciprocal pattern for improved household mobility. Baseline:  Goal status: INITIAL  4.  Patient will be able to complete his daily activities without his familiar pain exceeding 6/10. Baseline:  Goal status: INITIAL  PLAN:  PT FREQUENCY: 1-2x/week  PT DURATION: 3 weeks  PLANNED INTERVENTIONS: Therapeutic exercises, Therapeutic activity, Neuromuscular re-education, Balance training, Gait training, Patient/Family education, Self Care, Joint mobilization, Stair training, Electrical stimulation, Cryotherapy, Moist heat, Taping, Vasopneumatic device, Manual therapy, and Re-evaluation  PLAN FOR NEXT SESSION: NuStep, isometrics, and modalities as needed   Granville Lewis, PT 02/26/2023,  7:21 PM

## 2023-03-01 NOTE — Telephone Encounter (Signed)
I called pt's plan- they denied Monovisc. Plan prefers patient try Synvisc, Synvisc- one or Orthovisc. Orthovisc also requires PA. I re- faxed PA form for Orthovisc.

## 2023-03-05 NOTE — Telephone Encounter (Signed)
Rec''d fax stating Orthovisc PA is approved from 03/01/23 to 08/28/23.  Rec'd summary of benefits for Orthovisc. Deductible does not apply. Once $3000 OOP max is met (Met: $1217.62), coverage goes to 100%. Patient owes $75 copay. PA is required. PA form completed and faxed to [facility].

## 2023-03-07 NOTE — Telephone Encounter (Signed)
Pt informed of below.  OV scheduled. Orthovisc ordered.

## 2023-03-14 ENCOUNTER — Encounter: Payer: Self-pay | Admitting: Family Medicine

## 2023-03-14 ENCOUNTER — Ambulatory Visit: Payer: BC Managed Care – PPO | Admitting: Family Medicine

## 2023-03-14 VITALS — BP 124/80 | Ht 68.0 in | Wt 280.0 lb

## 2023-03-14 DIAGNOSIS — M1712 Unilateral primary osteoarthritis, left knee: Secondary | ICD-10-CM | POA: Diagnosis not present

## 2023-03-14 MED ORDER — HYALURONAN 30 MG/2ML IX SOSY
30.0000 mg | PREFILLED_SYRINGE | Freq: Once | INTRA_ARTICULAR | Status: AC
  Administered 2023-03-14: 30 mg via INTRA_ARTICULAR

## 2023-03-14 NOTE — Progress Notes (Signed)
PCP: Raliegh Ip, DO  Subjective:   HPI: Patient is a 52 y.o. male here for orthovisc injection.  Patient returns for orthovisc injection left knee  Past Medical History:  Diagnosis Date   Clotting disorder (HCC)    Hyperlipidemia    Hypertension     Current Outpatient Medications on File Prior to Visit  Medication Sig Dispense Refill   amLODipine-benazepril (LOTREL) 5-20 MG capsule TAKE 1 CAPSULE BY MOUTH EVERY DAY 90 capsule 3   atorvastatin (LIPITOR) 40 MG tablet TAKE 1 TABLET (40 MG TOTAL) BY MOUTH DAILY. 90 tablet 3   colchicine 0.6 MG tablet TAKE 2 TABS ON DAY 1 OF GOUT FLARE, MAY REPEAT 1 TAB IN 1 HOUR IF NEEDED. ON DAY 2, TAKE 1 TAB DAILY UNTIL GOUT FLARE RESOLVES 90 tablet 0   HYDROcodone-acetaminophen (NORCO) 5-325 MG tablet Take 1 tablet by mouth every 6 (six) hours as needed for moderate pain. (Patient not taking: Reported on 02/26/2023) 12 tablet 0   levothyroxine (SYNTHROID) 112 MCG tablet Take 1 tablet (112 mcg total) by mouth daily. 90 tablet 3   sildenafil (REVATIO) 20 MG tablet TAKE 2-5 TABLETS BY MOUTH AT ONCE, WITH EACH SEXUAL ENCOUNTER (Patient not taking: Reported on 02/26/2023) 150 tablet 1   No current facility-administered medications on file prior to visit.    Past Surgical History:  Procedure Laterality Date   BICEPS TENDON REPAIR     KNEE SURGERY Left 2003    Allergies  Allergen Reactions   Ibuprofen Anaphylaxis    FACIAL/ ORAL swelling    BP 124/80 (BP Location: Left Arm, Patient Position: Sitting)   Ht 5\' 8"  (1.727 m)   Wt 280 lb (127 kg)   BMI 42.57 kg/m       No data to display              No data to display              Objective:  Physical Exam:  Gen: NAD, comfortable in exam room    Assessment & Plan:  1. Left knee osteoarthritis - orthovisc injection given as below.  F/u in 1 week for second of three injections.  After informed written consent timeout was performed, patient was lying supine on exam table.  Left knee was prepped with alcohol swab and utilizing superolateral approach with ultrasound guidance, patient's left knee was injected intraarticularly with 3mL lidocaine followed by orthovisc. Patient tolerated the procedure well without immediate complications.

## 2023-03-21 ENCOUNTER — Ambulatory Visit: Payer: BC Managed Care – PPO | Admitting: Family Medicine

## 2023-03-21 ENCOUNTER — Encounter: Payer: Self-pay | Admitting: Family Medicine

## 2023-03-21 VITALS — BP 120/88 | Ht 68.0 in | Wt 280.0 lb

## 2023-03-21 DIAGNOSIS — M1712 Unilateral primary osteoarthritis, left knee: Secondary | ICD-10-CM | POA: Diagnosis not present

## 2023-03-21 MED ORDER — HYALURONAN 30 MG/2ML IX SOSY
30.0000 mg | PREFILLED_SYRINGE | Freq: Once | INTRA_ARTICULAR | Status: AC
  Administered 2023-03-21: 30 mg via INTRA_ARTICULAR

## 2023-03-21 NOTE — Progress Notes (Signed)
PCP: Raliegh Ip, DO  Subjective:   HPI: Patient is a 52 y.o. male here for orthovisc injection.  Patient returns for second orthovisc injection for left knee.  Past Medical History:  Diagnosis Date   Clotting disorder (HCC)    Hyperlipidemia    Hypertension     Current Outpatient Medications on File Prior to Visit  Medication Sig Dispense Refill   amLODipine-benazepril (LOTREL) 5-20 MG capsule TAKE 1 CAPSULE BY MOUTH EVERY DAY 90 capsule 3   atorvastatin (LIPITOR) 40 MG tablet TAKE 1 TABLET (40 MG TOTAL) BY MOUTH DAILY. 90 tablet 3   colchicine 0.6 MG tablet TAKE 2 TABS ON DAY 1 OF GOUT FLARE, MAY REPEAT 1 TAB IN 1 HOUR IF NEEDED. ON DAY 2, TAKE 1 TAB DAILY UNTIL GOUT FLARE RESOLVES 90 tablet 0   HYDROcodone-acetaminophen (NORCO) 5-325 MG tablet Take 1 tablet by mouth every 6 (six) hours as needed for moderate pain. (Patient not taking: Reported on 02/26/2023) 12 tablet 0   levothyroxine (SYNTHROID) 112 MCG tablet Take 1 tablet (112 mcg total) by mouth daily. 90 tablet 3   sildenafil (REVATIO) 20 MG tablet TAKE 2-5 TABLETS BY MOUTH AT ONCE, WITH EACH SEXUAL ENCOUNTER (Patient not taking: Reported on 02/26/2023) 150 tablet 1   No current facility-administered medications on file prior to visit.    Past Surgical History:  Procedure Laterality Date   BICEPS TENDON REPAIR     KNEE SURGERY Left 2003    Allergies  Allergen Reactions   Ibuprofen Anaphylaxis    FACIAL/ ORAL swelling    BP 120/88 (BP Location: Left Arm, Patient Position: Sitting)   Ht 5\' 8"  (1.727 m)   Wt 280 lb (127 kg)   BMI 42.57 kg/m       No data to display              No data to display              Objective:  Physical Exam:  Gen: NAD, comfortable in exam room  Left knee exam not repeated today.   Assessment & Plan:  1. Left knee arthritis - second orthovisc injection given today.  F/u in 1 week for third injection.  After informed written consent timeout was performed,  patient was lying supine on exam table. Left knee was prepped with alcohol swab and utilizing superolateral approach with ultrasound guidance, patient's left knee was injected intraarticularly with 3:1 lidocaine: depomedrol. Patient tolerated the procedure well without immediate complications.

## 2023-03-28 ENCOUNTER — Ambulatory Visit: Payer: BC Managed Care – PPO | Admitting: Family Medicine

## 2023-03-28 ENCOUNTER — Encounter: Payer: Self-pay | Admitting: Family Medicine

## 2023-03-28 VITALS — BP 120/88 | Ht 68.0 in | Wt 280.0 lb

## 2023-03-28 DIAGNOSIS — M1712 Unilateral primary osteoarthritis, left knee: Secondary | ICD-10-CM | POA: Diagnosis not present

## 2023-03-28 MED ORDER — HYALURONAN 30 MG/2ML IX SOSY
30.0000 mg | PREFILLED_SYRINGE | Freq: Once | INTRA_ARTICULAR | Status: AC
  Administered 2023-03-28: 30 mg via INTRA_ARTICULAR

## 2023-03-28 NOTE — Progress Notes (Signed)
PCP: Raliegh Ip, DO  Subjective:   HPI: Patient is a 52 y.o. male here for orthovisc injection.  Patient reports improvement since starting orthovisc series.  Past Medical History:  Diagnosis Date   Clotting disorder (HCC)    Hyperlipidemia    Hypertension     Current Outpatient Medications on File Prior to Visit  Medication Sig Dispense Refill   amLODipine-benazepril (LOTREL) 5-20 MG capsule TAKE 1 CAPSULE BY MOUTH EVERY DAY 90 capsule 3   atorvastatin (LIPITOR) 40 MG tablet TAKE 1 TABLET (40 MG TOTAL) BY MOUTH DAILY. 90 tablet 3   colchicine 0.6 MG tablet TAKE 2 TABS ON DAY 1 OF GOUT FLARE, MAY REPEAT 1 TAB IN 1 HOUR IF NEEDED. ON DAY 2, TAKE 1 TAB DAILY UNTIL GOUT FLARE RESOLVES 90 tablet 0   HYDROcodone-acetaminophen (NORCO) 5-325 MG tablet Take 1 tablet by mouth every 6 (six) hours as needed for moderate pain. (Patient not taking: Reported on 02/26/2023) 12 tablet 0   levothyroxine (SYNTHROID) 112 MCG tablet Take 1 tablet (112 mcg total) by mouth daily. 90 tablet 3   sildenafil (REVATIO) 20 MG tablet TAKE 2-5 TABLETS BY MOUTH AT ONCE, WITH EACH SEXUAL ENCOUNTER (Patient not taking: Reported on 02/26/2023) 150 tablet 1   No current facility-administered medications on file prior to visit.    Past Surgical History:  Procedure Laterality Date   BICEPS TENDON REPAIR     KNEE SURGERY Left 2003    Allergies  Allergen Reactions   Ibuprofen Anaphylaxis    FACIAL/ ORAL swelling    BP 120/88 (BP Location: Left Arm, Patient Position: Sitting)   Ht 5\' 8"  (1.727 m)   Wt 280 lb (127 kg)   BMI 42.57 kg/m       No data to display              No data to display              Objective:  Physical Exam:  Gen: NAD, comfortable in exam room    Assessment & Plan:  1. Left knee arthritis - third orthovisc injection given today.  F/u prn.  After informed written consent timeout was performed, patient was lying supine on exam table. Left knee was prepped with  alcohol swab and utilizing superolateral approach with ultrasound guidance, patient's left knee was injected intraarticularly with 3mL lidocaine followed by orthovisc. Patient tolerated the procedure well without immediate complications.

## 2023-04-28 ENCOUNTER — Encounter: Payer: Self-pay | Admitting: Family Medicine

## 2023-04-30 ENCOUNTER — Other Ambulatory Visit: Payer: Self-pay | Admitting: *Deleted

## 2023-04-30 DIAGNOSIS — M1712 Unilateral primary osteoarthritis, left knee: Secondary | ICD-10-CM

## 2023-05-06 NOTE — Addendum Note (Signed)
Addended by: Rutha Bouchard E on: 05/06/2023 02:57 PM   Modules accepted: Orders

## 2023-05-22 ENCOUNTER — Ambulatory Visit: Payer: BC Managed Care – PPO | Admitting: Orthopaedic Surgery

## 2023-05-22 ENCOUNTER — Encounter: Payer: Self-pay | Admitting: Orthopaedic Surgery

## 2023-05-22 VITALS — Ht 68.0 in | Wt 298.0 lb

## 2023-05-22 DIAGNOSIS — M1712 Unilateral primary osteoarthritis, left knee: Secondary | ICD-10-CM

## 2023-05-22 NOTE — Progress Notes (Signed)
Office Visit Note   Patient: [name] [name]           Date of Birth: 1971/06/09           MRN: [id number] Visit Date: 05/22/2023              Requested by: Lenda Kelp, MD [address] [place],  [place] [address] PCP: Raliegh Ip, DO   Assessment & Plan: Visit Diagnoses:  1. Primary osteoarthritis of left knee     Plan:  Discussed MRI findings and the fact that the patient has failed conservative treatment at length with him.  He discussed quad strengthening exercises.  Unfortunately given his BMI of 45.3 we are unable to perform surgery on him.  He will need to wait.  Discussed that he would need to lose about 40 pounds to get under the BMI of 40 such that we could do his surgery.  Discussed portion sizes with him.  Questions were encouraged and answered by Dr. Magnus Ivan myself.  Will see him back in 3 months for height and weight check.  Follow-Up Instructions: Return in about 3 months (around 08/22/2023).   Orders:  No orders of the defined types were placed in this encounter.  No orders of the defined types were placed in this encounter.     Procedures: No procedures performed   Clinical Data: No additional findings.   Subjective: Chief Complaint  Patient presents with   Left Knee - Pain    HPI [name] [name] is a 52 year old male who comes in today due to left knee pain.  He works as a Copywriter, advertising for [employer].  He has had left knee pain for some time.  He has tried cortisone injections supplemental injections offloading brace and continues to have pain in the left knee.  Pain does not keep him awake at night.  He notes that the knee locks up at times.  History of left patella tendon rupture repair 2003 by another orthopedic surgeon here in town.  He has had no new injury to the knee.  He states cortisone injections and supplemental injections gave him no more than 2 weeks of relief.  Patient's nondiabetic.  Does have a medical history pertinent for  hypertension, right bundle branch block sleep apnea hypothyroidism and pulmonary embolism felt to be secondary to COVID.  He is on no blood thinners at this point in time. Radiographs of his left knee dated April 30, 2019 are reviewed.  This shows mild to moderate patellofemoral arthritis.  Mild medial compartmental narrowing.  MRI left knee dated 02/04/2023 shows tricompartmental arthritis.  Patellofemoral compartment with advanced degenerative changes with areas of full-thickness cartilage loss.  Cystic changes within the patella.  Medial compartment full-thickness cartilage loss areas with subchondral edema.  Lateral compartment with moderate degenerative changes with periarticular spurring.   Review of Systems See HPI otherwise negative or noncontributory.  Objective: Vital Signs: Ht 5\' 8"  (1.727 m)   Wt 298 lb (135.2 kg)   BMI 45.31 kg/m   Physical Exam Constitutional:      Appearance: He is obese. He is not ill-appearing or diaphoretic.  Pulmonary:     Effort: Pulmonary effort is normal.  Neurological:     Mental Status: He is alert and oriented to person, place, and time.  Psychiatric:        Mood and Affect: Mood normal.     Ortho Exam Bilateral knees: Right knee 0 to 120 degrees left knee 0 to  115 degrees.  Tenderness along medial joint line left knee.  Patellofemoral crepitus left greater than right knee.  No instability valgus varus stressing of either knee.  No abnormal warmth erythema or effusion of either knee.  Well-healed left knee anterior incision.  He is able to do straight leg raise bilaterally.  Calves are supple nontender bilaterally. Specialty Comments:  No specialty comments available.  Imaging: No results found.   PMFS History: Patient Active Problem List   Diagnosis Date Noted   Tenosynovitis of left lower leg 12/17/2022   Left leg swelling 12/17/2022   OA (osteoarthritis) of knee 11/26/2022   Morbid obesity (HCC) 05/21/2022   Pure hypercholesterolemia  05/21/2022   COVID-19 vaccine series completed 05/05/2020   Pulmonary embolism (HCC) 10/21/2019   Hypothyroidism 02/18/2018   BMI 40.0-44.9, adult (HCC) 08/20/2017   Hyperlipidemia 10/19/2015   RBBB 09/20/2015   Obstructive sleep apnea 05/14/2014   Essential hypertension 04/28/2013   Past Medical History:  Diagnosis Date   Clotting disorder (HCC)    Hyperlipidemia    Hypertension     Family History  Problem Relation Age of Onset   Cancer Mother        breast cancer   Hypertension Mother    Heart attack Father    Diabetes Father    Hypertension Father     Past Surgical History:  Procedure Laterality Date   BICEPS TENDON REPAIR     KNEE SURGERY Left 2003   Social History   Occupational History   Not on file  Tobacco Use   Smoking status: Never   Smokeless tobacco: Current    Types: Snuff  Vaping Use   Vaping status: Never Used  Substance and Sexual Activity   Alcohol use: No    Comment: 0CC   Drug use: No   Sexual activity: Not on file

## 2023-05-29 ENCOUNTER — Other Ambulatory Visit: Payer: Self-pay | Admitting: Family Medicine

## 2023-05-29 DIAGNOSIS — M10072 Idiopathic gout, left ankle and foot: Secondary | ICD-10-CM

## 2023-05-29 NOTE — Telephone Encounter (Signed)
Last office visit 11/21/22 Last refill 02/02/23

## 2023-06-05 ENCOUNTER — Encounter (INDEPENDENT_AMBULATORY_CARE_PROVIDER_SITE_OTHER): Payer: Self-pay | Admitting: *Deleted

## 2023-06-05 ENCOUNTER — Encounter: Payer: Self-pay | Admitting: Family Medicine

## 2023-06-05 ENCOUNTER — Ambulatory Visit (INDEPENDENT_AMBULATORY_CARE_PROVIDER_SITE_OTHER): Payer: BC Managed Care – PPO | Admitting: Family Medicine

## 2023-06-05 ENCOUNTER — Other Ambulatory Visit (HOSPITAL_COMMUNITY): Payer: Self-pay

## 2023-06-05 VITALS — BP 119/78 | HR 69 | Temp 98.5°F | Ht 68.0 in | Wt 304.0 lb

## 2023-06-05 DIAGNOSIS — R6889 Other general symptoms and signs: Secondary | ICD-10-CM | POA: Diagnosis not present

## 2023-06-05 DIAGNOSIS — E7800 Pure hypercholesterolemia, unspecified: Secondary | ICD-10-CM

## 2023-06-05 DIAGNOSIS — Z1211 Encounter for screening for malignant neoplasm of colon: Secondary | ICD-10-CM

## 2023-06-05 DIAGNOSIS — Z23 Encounter for immunization: Secondary | ICD-10-CM

## 2023-06-05 DIAGNOSIS — Z0001 Encounter for general adult medical examination with abnormal findings: Secondary | ICD-10-CM

## 2023-06-05 DIAGNOSIS — E034 Atrophy of thyroid (acquired): Secondary | ICD-10-CM | POA: Diagnosis not present

## 2023-06-05 DIAGNOSIS — Z6841 Body Mass Index (BMI) 40.0 and over, adult: Secondary | ICD-10-CM | POA: Diagnosis not present

## 2023-06-05 DIAGNOSIS — Z1159 Encounter for screening for other viral diseases: Secondary | ICD-10-CM | POA: Diagnosis not present

## 2023-06-05 DIAGNOSIS — Z8739 Personal history of other diseases of the musculoskeletal system and connective tissue: Secondary | ICD-10-CM

## 2023-06-05 DIAGNOSIS — I10 Essential (primary) hypertension: Secondary | ICD-10-CM

## 2023-06-05 DIAGNOSIS — Z0000 Encounter for general adult medical examination without abnormal findings: Secondary | ICD-10-CM

## 2023-06-05 DIAGNOSIS — G4733 Obstructive sleep apnea (adult) (pediatric): Secondary | ICD-10-CM

## 2023-06-05 DIAGNOSIS — R7309 Other abnormal glucose: Secondary | ICD-10-CM | POA: Diagnosis not present

## 2023-06-05 DIAGNOSIS — Z125 Encounter for screening for malignant neoplasm of prostate: Secondary | ICD-10-CM

## 2023-06-05 LAB — BAYER DCA HB A1C WAIVED: HB A1C (BAYER DCA - WAIVED): 5.6 % (ref 4.8–5.6)

## 2023-06-05 MED ORDER — SEMAGLUTIDE-WEIGHT MANAGEMENT 0.25 MG/0.5ML ~~LOC~~ SOAJ
0.2500 mg | SUBCUTANEOUS | 0 refills | Status: DC
  Filled 2023-06-05: qty 2, 28d supply, fill #0

## 2023-06-05 MED ORDER — ATORVASTATIN CALCIUM 40 MG PO TABS: ORAL_TABLET | ORAL | 3 refills | Status: DC

## 2023-06-05 MED ORDER — SEMAGLUTIDE-WEIGHT MANAGEMENT 1 MG/0.5ML ~~LOC~~ SOAJ
1.0000 mg | SUBCUTANEOUS | 0 refills | Status: AC
  Filled 2023-06-05: qty 2, 28d supply, fill #0

## 2023-06-05 MED ORDER — SEMAGLUTIDE-WEIGHT MANAGEMENT 1 MG/0.5ML ~~LOC~~ SOAJ
1.0000 mg | SUBCUTANEOUS | 0 refills | Status: DC
  Filled 2023-06-05: qty 2, 28d supply, fill #0

## 2023-06-05 MED ORDER — ONDANSETRON 4 MG PO TBDP: 4.0000 mg | ORAL_TABLET | Freq: Three times a day (TID) | ORAL | 0 refills | Status: DC | PRN

## 2023-06-05 MED ORDER — SEMAGLUTIDE-WEIGHT MANAGEMENT 0.5 MG/0.5ML ~~LOC~~ SOAJ
0.5000 mg | SUBCUTANEOUS | 0 refills | Status: AC
  Filled 2023-06-05: qty 2, 28d supply, fill #0

## 2023-06-05 MED ORDER — SILDENAFIL CITRATE 20 MG PO TABS: ORAL_TABLET | ORAL | 1 refills | Status: DC

## 2023-06-05 MED ORDER — SEMAGLUTIDE-WEIGHT MANAGEMENT 0.5 MG/0.5ML ~~LOC~~ SOAJ
0.5000 mg | SUBCUTANEOUS | 0 refills | Status: DC
  Filled 2023-06-05: qty 2, 28d supply, fill #0

## 2023-06-05 MED ORDER — LEVOTHYROXINE SODIUM 112 MCG PO TABS: 112.0000 ug | ORAL_TABLET | Freq: Every day | ORAL | 3 refills | Status: DC

## 2023-06-05 MED ORDER — AMLODIPINE BESY-BENAZEPRIL HCL 5-20 MG PO CAPS: ORAL_CAPSULE | ORAL | 3 refills | Status: DC

## 2023-06-05 MED ORDER — SEMAGLUTIDE-WEIGHT MANAGEMENT 1.7 MG/0.75ML ~~LOC~~ SOAJ
1.7000 mg | SUBCUTANEOUS | 0 refills | Status: AC
  Filled 2023-06-05: qty 3, 28d supply, fill #0

## 2023-06-05 MED ORDER — SEMAGLUTIDE-WEIGHT MANAGEMENT 2.4 MG/0.75ML ~~LOC~~ SOAJ
2.4000 mg | SUBCUTANEOUS | 3 refills | Status: DC
  Filled 2023-06-05: qty 9, fill #0

## 2023-06-05 MED ORDER — SEMAGLUTIDE-WEIGHT MANAGEMENT 0.25 MG/0.5ML ~~LOC~~ SOAJ
0.2500 mg | SUBCUTANEOUS | 0 refills | Status: AC
  Filled 2023-06-05: qty 2, 28d supply, fill #0

## 2023-06-05 MED ORDER — SEMAGLUTIDE-WEIGHT MANAGEMENT 1.7 MG/0.75ML ~~LOC~~ SOAJ
1.7000 mg | SUBCUTANEOUS | 0 refills | Status: DC
  Filled 2023-06-05: qty 3, 28d supply, fill #0

## 2023-06-05 NOTE — Progress Notes (Signed)
[name] [name] is a 52 y.o. male presents to office today for annual physical exam examination.    Concerns today include: 1.  Morbid obesity Patient reports that he is due for a left total knee replacement and was told that he needs to lose 40 pounds.  We tried to get him on semaglutide for weight loss last year but he could not find it due to shortages.  He would like to try going back to that medication as he is not having a great amount of success with diet modification.  Physical activity is limited due to pain in the left knee and weakness of that knee.  He has been carb reducing.  He drinks half-and-half tea and water only.  No soda intake.  Has been reducing portions.  Has a strong family history of diabetes, MI.     Health Maintenance Due  Topic Date Due   Colonoscopy  Never done   DTaP/Tdap/Td (2 - Td or Tdap) 04/29/2023   Refills needed today: all  Immunization History  Administered Date(s) Administered   PFIZER(Purple Top)SARS-COV-2 Vaccination 12/10/2019, 01/06/2020, 10/03/2020   Tdap 04/28/2013   Past Medical History:  Diagnosis Date   Clotting disorder (HCC)    Hyperlipidemia    Hypertension    Social History   Socioeconomic History   Marital status: Married    Spouse name: Not on file   Number of children: Not on file   Years of education: Not on file   Highest education level: Not on file  Occupational History   Not on file  Tobacco Use   Smoking status: Never   Smokeless tobacco: Current    Types: Snuff  Vaping Use   Vaping status: Never Used  Substance and Sexual Activity   Alcohol use: No    Comment: 0CC   Drug use: No   Sexual activity: Not on file  Other Topics Concern   Not on file  Social History Narrative   Not on file   Social Determinants of Health   Financial Resource Strain: Not on file  Food Insecurity: Not on file  Transportation Needs: Not on file  Physical Activity: Not on file  Stress: Not on file  Social Connections:  Unknown (02/11/2022)   Received from [facility]   Social Network    Social Network: Not on file  Intimate Partner Violence: Unknown (01/03/2022)   Received from [facility]   HITS    Physically Hurt: Not on file    Insult or Talk Down To: Not on file    Threaten Physical Harm: Not on file    Scream or Curse: Not on file   Past Surgical History:  Procedure Laterality Date   BICEPS TENDON REPAIR     KNEE SURGERY Left 2003   Family History  Problem Relation Age of Onset   Cancer Mother        breast cancer   Hypertension Mother    Heart attack Father    Diabetes Father    Hypertension Father     Current Outpatient Medications:    colchicine 0.6 MG tablet, TAKE 2 TABS ON DAY 1 OF GOUT FLARE, MAY REPEAT 1 TAB IN 1 HOUR IF NEEDED. ON DAY 2, TAKE 1 TAB DAILY UNTIL GOUT FLARE RESOLVES, Disp: 90 tablet, Rfl: PRN   Semaglutide-Weight Management 0.25 MG/0.5ML SOAJ, Inject 0.25 mg into the skin once a week for 28 days., Disp: 2 mL, Rfl: 0   [START ON  07/04/2023] Semaglutide-Weight Management 0.5 MG/0.5ML SOAJ, Inject 0.5 mg into the skin once a week for 28 days., Disp: 2 mL, Rfl: 0   [START ON 08/02/2023] Semaglutide-Weight Management 1 MG/0.5ML SOAJ, Inject 1 mg into the skin once a week for 28 days., Disp: 2 mL, Rfl: 0   [START ON 08/31/2023] Semaglutide-Weight Management 1.7 MG/0.75ML SOAJ, Inject 1.7 mg into the skin once a week for 28 days., Disp: 3 mL, Rfl: 0   [START ON 09/29/2023] Semaglutide-Weight Management 2.4 MG/0.75ML SOAJ, Inject 2.4 mg into the skin once a week., Disp: 9 mL, Rfl: 3   amLODipine-benazepril (LOTREL) 5-20 MG capsule, TAKE 1 CAPSULE BY MOUTH EVERY DAY, Disp: 90 capsule, Rfl: 3   atorvastatin (LIPITOR) 40 MG tablet, TAKE 1 TABLET (40 MG TOTAL) BY MOUTH DAILY., Disp: 90 tablet, Rfl: 3   levothyroxine (SYNTHROID) 112 MCG tablet, Take 1 tablet (112 mcg total) by mouth daily., Disp: 90 tablet, Rfl: 3   sildenafil (REVATIO) 20 MG tablet,  TAKE 2-5 TABLETS BY MOUTH AT ONCE, WITH EACH SEXUAL ENCOUNTER, Disp: 150 tablet, Rfl: 1  Allergies  Allergen Reactions   Ibuprofen Anaphylaxis    FACIAL/ ORAL swelling     ROS: Review of Systems Pertinent items noted in HPI and remainder of comprehensive ROS otherwise negative.    Physical exam BP 119/78   Pulse 69   Temp 98.5 F (36.9 C)   Ht 5\' 8"  (1.727 m)   Wt (!) 304 lb (137.9 kg)   SpO2 96%   BMI 46.22 kg/m  General appearance: alert, cooperative, appears stated age, and morbidly obese Head: Normocephalic, without obvious abnormality, atraumatic Eyes: negative findings: lids and lashes normal, conjunctivae and sclerae normal, corneas clear, and pupils equal, round, reactive to light and accomodation Ears: normal TM's and external ear canals both ears Nose: Nares normal. Septum midline. Mucosa normal. No drainage or sinus tenderness. Throat: lips, mucosa, and tongue normal; teeth and gums normal Neck: no adenopathy, supple, symmetrical, trachea midline, and thyroid not enlarged, symmetric, no tenderness/mass/nodules Back: symmetric, no curvature. ROM normal. No CVA tenderness. Lungs: clear to auscultation bilaterally Chest wall: no tenderness Heart: regular rate and rhythm, S1, S2 normal, no murmur, click, rub or gallop Abdomen:  Obese, protuberant.,  Soft, nontender Extremities: extremities normal, atraumatic, no cyanosis or edema Pulses: 2+ and symmetric Skin: Skin color, texture, turgor normal. No rashes or lesions Lymph nodes: Cervical, supraclavicular, and axillary nodes normal. Neurologic: Grossly normal MSK: Antalgic gait but ambulating independently.     06/05/2023    7:55 AM 11/21/2022    3:46 PM 12/22/2021    3:07 PM  Depression screen PHQ 2/9  Decreased Interest 0 0 0  Down, Depressed, Hopeless 0 0 0  PHQ - 2 Score 0 0 0  Altered sleeping 0 0   Tired, decreased energy 0 0   Change in appetite 0 0   Feeling bad or failure about yourself  0 0   Trouble  concentrating 0 0   Moving slowly or fidgety/restless 0 0   Suicidal thoughts 0 0   PHQ-9 Score 0 0   Difficult doing work/chores Not difficult at all Not difficult at all       06/05/2023    7:55 AM 11/21/2022    3:45 PM 12/22/2021    3:07 PM 09/12/2021   12:21 PM  GAD 7 : Generalized Anxiety Score  Nervous, Anxious, on Edge 0 0 0 0  Control/stop worrying 0 0 0 0  Worry too much -  different things 0 0 0 0  Trouble relaxing 0 0 0 0  Restless 0 0 0 0  Easily annoyed or irritable 0 0 0 0  Afraid - awful might happen 0 0 0 0  Total GAD 7 Score 0 0 0 0  Anxiety Difficulty Not difficult at all Not difficult at all Not difficult at all Not difficult at all     Assessment/ Plan: Erin Fulling here for annual physical exam.   Annual physical exam  Colon cancer screening - Plan: Ambulatory referral to Gastroenterology  Screening for malignant neoplasm of prostate - Plan: PSA  Encounter for hepatitis C screening test for low risk patient - Plan: Hepatitis C antibody  Hypothyroidism due to acquired atrophy of thyroid - Plan: TSH, T4, Free, levothyroxine (SYNTHROID) 112 MCG tablet  Essential hypertension - Plan: CMP14+EGFR, amLODipine-benazepril (LOTREL) 5-20 MG capsule, Semaglutide-Weight Management 0.5 MG/0.5ML SOAJ, Semaglutide-Weight Management 1 MG/0.5ML SOAJ, Semaglutide-Weight Management 1.7 MG/0.75ML SOAJ, Semaglutide-Weight Management 2.4 MG/0.75ML SOAJ, Semaglutide-Weight Management 0.25 MG/0.5ML SOAJ, DISCONTINUED: Semaglutide-Weight Management 0.25 MG/0.5ML SOAJ, DISCONTINUED: Semaglutide-Weight Management 0.5 MG/0.5ML SOAJ, DISCONTINUED: Semaglutide-Weight Management 1 MG/0.5ML SOAJ, DISCONTINUED: Semaglutide-Weight Management 1.7 MG/0.75ML SOAJ, DISCONTINUED: Semaglutide-Weight Management 2.4 MG/0.75ML SOAJ  Pure hypercholesterolemia - Plan: Lipid Panel, CMP14+EGFR, atorvastatin (LIPITOR) 40 MG tablet, Semaglutide-Weight Management 0.5 MG/0.5ML SOAJ, Semaglutide-Weight  Management 1 MG/0.5ML SOAJ, Semaglutide-Weight Management 1.7 MG/0.75ML SOAJ, Semaglutide-Weight Management 2.4 MG/0.75ML SOAJ, Semaglutide-Weight Management 0.25 MG/0.5ML SOAJ, DISCONTINUED: Semaglutide-Weight Management 0.25 MG/0.5ML SOAJ, DISCONTINUED: Semaglutide-Weight Management 0.5 MG/0.5ML SOAJ, DISCONTINUED: Semaglutide-Weight Management 1 MG/0.5ML SOAJ, DISCONTINUED: Semaglutide-Weight Management 1.7 MG/0.75ML SOAJ, DISCONTINUED: Semaglutide-Weight Management 2.4 MG/0.75ML SOAJ  BMI 45.0-49.9, adult (HCC) - Plan: Lipid Panel, CMP14+EGFR, VITAMIN D 25 Hydroxy (Vit-D Deficiency, Fractures), Bayer DCA Hb A1c Waived, Semaglutide-Weight Management 0.5 MG/0.5ML SOAJ, Semaglutide-Weight Management 1 MG/0.5ML SOAJ, Semaglutide-Weight Management 1.7 MG/0.75ML SOAJ, Semaglutide-Weight Management 2.4 MG/0.75ML SOAJ, Semaglutide-Weight Management 0.25 MG/0.5ML SOAJ, ondansetron (ZOFRAN-ODT) 4 MG disintegrating tablet, DISCONTINUED: Semaglutide-Weight Management 0.25 MG/0.5ML SOAJ, DISCONTINUED: Semaglutide-Weight Management 0.5 MG/0.5ML SOAJ, DISCONTINUED: Semaglutide-Weight Management 1 MG/0.5ML SOAJ, DISCONTINUED: Semaglutide-Weight Management 1.7 MG/0.75ML SOAJ, DISCONTINUED: Semaglutide-Weight Management 2.4 MG/0.75ML SOAJ  Morbid obesity (HCC) - Plan: Semaglutide-Weight Management 0.5 MG/0.5ML SOAJ, Semaglutide-Weight Management 1 MG/0.5ML SOAJ, Semaglutide-Weight Management 1.7 MG/0.75ML SOAJ, Semaglutide-Weight Management 2.4 MG/0.75ML SOAJ, Semaglutide-Weight Management 0.25 MG/0.5ML SOAJ, ondansetron (ZOFRAN-ODT) 4 MG disintegrating tablet, DISCONTINUED: Semaglutide-Weight Management 0.25 MG/0.5ML SOAJ, DISCONTINUED: Semaglutide-Weight Management 0.5 MG/0.5ML SOAJ, DISCONTINUED: Semaglutide-Weight Management 1 MG/0.5ML SOAJ, DISCONTINUED: Semaglutide-Weight Management 1.7 MG/0.75ML SOAJ, DISCONTINUED: Semaglutide-Weight Management 2.4 MG/0.75ML SOAJ  History of gout - Plan: CBC, Uric Acid  OSA on  CPAP  Referred for colonoscopy.  Tetanus shot administered.  Hepatitis C screening added to labs today.  Up-to-date on preventive health care otherwise as he declines both influenza and shingles vaccination today.  Will check thyroid levels.  Blood pressure controlled current regimen.  No changes needed.  Continue statin.  Check fasting lipid.  I have ordered ONGEXB.  I see no apparent contraindication to utilization.  Patient has failed lifestyle modification and has limited mobility secondary to need for left knee replacement.  Given family history of MI and multiple comorbidities that would cause concern I do not think phentermine is an appropriate treatment for this patient and subsequently Qsymia is also not an appropriate treatment for him.  I have encouraged him to continue his lifestyle modification in conjunction with the [facility] and we will plan to reconvene sometime in November for weight check and likely preop clearance.  Significant comorbidities include hypertension, hyperlipidemia, obstructive sleep apnea and advanced osteoarthritis  of the left knee. Zofran also sent for prn nausea  Uric acid ordered.  Has refills on colchicine  Continue CPAP for treatment of OSA  Counseled on healthy lifestyle choices, including diet (rich in fruits, vegetables and lean meats and low in salt and simple carbohydrates) and exercise (at least 30 minutes of moderate physical activity daily).  Patient to follow up November  Walfred Bettendorf M. Nadine Counts, DO

## 2023-06-05 NOTE — Patient Instructions (Signed)
Tips for success with Wegovy (and by success, how not to be super sick on your stomach): Eat small meals AVOID heavy foods (fried/ high in carbs like bread, pasta, rice) AVOID carbonated beverages (soda/ beer, as these can increase bloating) DOUBLE your water intake (will help you avoid constipation/ dehydration)  Wegovy CAN cause: Nausea Abdominal pain Increased acid reflux (sometimes presents as "sour burps") Constipation OR Diarrhea Fatigue (especially when you first start it)  

## 2023-06-06 ENCOUNTER — Other Ambulatory Visit (HOSPITAL_COMMUNITY): Payer: Self-pay

## 2023-06-06 ENCOUNTER — Encounter: Payer: Self-pay | Admitting: Family Medicine

## 2023-06-06 LAB — TSH: TSH: 6.11 u[IU]/mL — ABNORMAL HIGH (ref 0.450–4.500)

## 2023-06-06 LAB — CBC
Hematocrit: 43 % (ref 37.5–51.0)
Hemoglobin: 14.3 g/dL (ref 13.0–17.7)
MCH: 29.3 pg (ref 26.6–33.0)
MCHC: 33.3 g/dL (ref 31.5–35.7)
MCV: 88 fL (ref 79–97)
Platelets: 186 10*3/uL (ref 150–450)
RBC: 4.88 x10E6/uL (ref 4.14–5.80)
RDW: 14.6 % (ref 11.6–15.4)
WBC: 6.8 10*3/uL (ref 3.4–10.8)

## 2023-06-06 LAB — CMP14+EGFR
ALT: 39 IU/L (ref 0–44)
AST: 22 IU/L (ref 0–40)
Albumin: 4.1 g/dL (ref 3.8–4.9)
Alkaline Phosphatase: 66 IU/L (ref 44–121)
BUN/Creatinine Ratio: 12 (ref 9–20)
BUN: 15 mg/dL (ref 6–24)
Bilirubin Total: 0.4 mg/dL (ref 0.0–1.2)
CO2: 21 mmol/L (ref 20–29)
Calcium: 9.1 mg/dL (ref 8.7–10.2)
Chloride: 104 mmol/L (ref 96–106)
Creatinine, Ser: 1.27 mg/dL (ref 0.76–1.27)
Globulin, Total: 2.6 g/dL (ref 1.5–4.5)
Glucose: 107 mg/dL — ABNORMAL HIGH (ref 70–99)
Potassium: 4.4 mmol/L (ref 3.5–5.2)
Sodium: 139 mmol/L (ref 134–144)
Total Protein: 6.7 g/dL (ref 6.0–8.5)
eGFR: 68 mL/min/{1.73_m2} (ref >59)

## 2023-06-06 LAB — LIPID PANEL
Chol/HDL Ratio: 5.7 ratio — ABNORMAL HIGH (ref 0.0–5.0)
Cholesterol, Total: 200 mg/dL — ABNORMAL HIGH (ref 100–199)
HDL: 35 mg/dL — ABNORMAL LOW (ref >39)
LDL Chol Calc (NIH): 119 mg/dL — ABNORMAL HIGH (ref 0–99)
Triglycerides: 259 mg/dL — ABNORMAL HIGH (ref 0–149)
VLDL Cholesterol Cal: 46 mg/dL — ABNORMAL HIGH (ref 5–40)

## 2023-06-06 LAB — VITAMIN D 25 HYDROXY (VIT D DEFICIENCY, FRACTURES): Vit D, 25-Hydroxy: 48.6 ng/mL (ref 30.0–100.0)

## 2023-06-06 LAB — PSA: Prostate Specific Ag, Serum: 0.7 ng/mL (ref 0.0–4.0)

## 2023-06-06 LAB — URIC ACID: Uric Acid: 9.8 mg/dL — ABNORMAL HIGH (ref 3.8–8.4)

## 2023-06-06 LAB — T4, FREE: Free T4: 1.16 ng/dL (ref 0.82–1.77)

## 2023-06-06 LAB — HEPATITIS C ANTIBODY: Hep C Virus Ab: NONREACTIVE

## 2023-06-07 ENCOUNTER — Other Ambulatory Visit (HOSPITAL_COMMUNITY): Payer: Self-pay

## 2023-06-11 ENCOUNTER — Other Ambulatory Visit (HOSPITAL_COMMUNITY): Payer: Self-pay

## 2023-06-12 ENCOUNTER — Other Ambulatory Visit (HOSPITAL_COMMUNITY): Payer: Self-pay

## 2023-08-22 ENCOUNTER — Encounter: Payer: Self-pay | Admitting: Orthopaedic Surgery

## 2023-08-22 ENCOUNTER — Ambulatory Visit: Payer: BC Managed Care – PPO | Admitting: Orthopaedic Surgery

## 2023-08-22 VITALS — Ht 69.0 in | Wt 276.2 lb

## 2023-08-22 DIAGNOSIS — M1712 Unilateral primary osteoarthritis, left knee: Secondary | ICD-10-CM | POA: Diagnosis not present

## 2023-08-22 NOTE — Progress Notes (Signed)
Office Visit Note   Patient: [name] [name]           Date of Birth: 1971-04-21           MRN: [id number] Visit Date: 08/22/2023              Requested by: Raliegh Ip, DO [address] [place],  [place] [address] PCP: Raliegh Ip, DO   Assessment & Plan: Visit Diagnoses: No diagnosis found.  Plan: Discussed with patient that given his significant improvement in knee pain at this time, goal will be to continue to push out surgery as he is still not at goal weight and also given his early age he would benefit from a knee replacement later on in his life.  Patient can repeat the gel shot 6 months from previously done, can always do that here or at sports medicine clinic.  Would recommend repeating those as he is got benefit from them.  Would also recommend continuing the gym with the weight loss as well as the strengthening of the quadricep muscles.  Patient to follow-up as needed going forward, patient is agreeable and understanding with plan.  Follow-Up Instructions: No follow-ups on file.   Orders:  No orders of the defined types were placed in this encounter.  No orders of the defined types were placed in this encounter.     Procedures: No procedures performed   Clinical Data: No additional findings.   Subjective: Chief Complaint  Patient presents with   Left Knee - Follow-up    Patient is presenting for follow-up of his left knee.  Patient recently underwent Orthovisc injections 3 done approximately 1-1/2 months ago.  Patient states that since having his injections to 1 month after he did not notice any improvement however patient states that approximately 6 weeks after the original injections he has noticed that his pain has decreased significantly.  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  Patient states that he does  still have some nighttime pain but is not nearly as bad as it used to be.  Patient has been losing some weight and is currently 276 pounds, BMI of 40.79.  Patient plans to be continuously losing weight and wants to be 215 pounds by this time next year.  Patient otherwise has no other concerns at this time.    Review of Systems   Objective: Vital Signs: Ht 5\' 9"  (1.753 m)   Wt 276 lb 3.2 oz (125.3 kg)   BMI 40.79 kg/m   Physical Exam  Left knee: Inspection reveals no erythema, swelling or bony abnormality.  No signs of any infection or warmth.  There is some tenderness to palpation over the medial joint line, no TTP over the lateral joint line.  No tenderness to palpation over the quadriceps tendon or the patellar tendon.  No tenderness to palpation over the patella itself or its medial lateral borders.  Range of motion is full with flexion and extension of the knee, no pain with active resistance against extension or flexion.  Strength is 5 out of 5 with extension and flexion. Special tests: Negative McMurray, negative patellar grind, negative Apley's  Ortho Exam  Specialty Comments:  No specialty comments available.  Imaging: No results found.   PMFS History: Patient Active Problem List   Diagnosis Date Noted  Tenosynovitis of left lower leg 12/17/2022   Left leg swelling 12/17/2022   OA (osteoarthritis) of knee 11/26/2022   Morbid obesity (HCC) 05/21/2022   Pure hypercholesterolemia 05/21/2022   COVID-19 vaccine series completed 05/05/2020   Pulmonary embolism (HCC) 10/21/2019   Hypothyroidism 02/18/2018   BMI 40.0-44.9, adult (HCC) 08/20/2017   Hyperlipidemia 10/19/2015   RBBB 09/20/2015   Obstructive sleep apnea 05/14/2014   Essential hypertension 04/28/2013   Past Medical History:  Diagnosis Date   Clotting disorder (HCC)    Hyperlipidemia    Hypertension     Family History  Problem Relation Age of Onset   Cancer Mother        breast cancer   Hypertension  Mother    Heart attack Father    Diabetes Father    Hypertension Father     Past Surgical History:  Procedure Laterality Date   BICEPS TENDON REPAIR     KNEE SURGERY Left 2003   Social History   Occupational History   Not on file  Tobacco Use   Smoking status: Never   Smokeless tobacco: Current    Types: Snuff  Vaping Use   Vaping status: Never Used  Substance and Sexual Activity   Alcohol use: No    Comment: 0CC   Drug use: No   Sexual activity: Not on file

## 2023-08-22 NOTE — Progress Notes (Signed)
I have seen the patient and agree with Dr. Rosie Fate note and treatment plan.  The patient continues on a weight loss journey and if he gets to the point where he would like to try hyaluronic acid again for his knees, he will let us know.

## 2023-11-29 ENCOUNTER — Encounter: Payer: Self-pay | Admitting: Family Medicine

## 2023-12-03 ENCOUNTER — Encounter (INDEPENDENT_AMBULATORY_CARE_PROVIDER_SITE_OTHER): Payer: Self-pay | Admitting: *Deleted

## 2024-05-30 ENCOUNTER — Other Ambulatory Visit: Payer: Self-pay | Admitting: Family Medicine

## 2024-05-30 DIAGNOSIS — M10072 Idiopathic gout, left ankle and foot: Secondary | ICD-10-CM

## 2024-06-02 NOTE — Telephone Encounter (Signed)
 [facility] NTBS last OV 06/05/23 NO RF sent to pharmacy last OV greater than a year

## 2024-06-02 NOTE — Telephone Encounter (Signed)
 CPE scheduled for 07/17/2024

## 2024-07-17 ENCOUNTER — Ambulatory Visit (INDEPENDENT_AMBULATORY_CARE_PROVIDER_SITE_OTHER): Admitting: Family Medicine

## 2024-07-17 ENCOUNTER — Encounter: Payer: Self-pay | Admitting: Family Medicine

## 2024-07-17 VITALS — BP 124/79 | HR 77 | Temp 98.9°F | Ht 69.0 in | Wt 296.4 lb

## 2024-07-17 DIAGNOSIS — I10 Essential (primary) hypertension: Secondary | ICD-10-CM | POA: Diagnosis not present

## 2024-07-17 DIAGNOSIS — Z0000 Encounter for general adult medical examination without abnormal findings: Secondary | ICD-10-CM

## 2024-07-17 DIAGNOSIS — Z125 Encounter for screening for malignant neoplasm of prostate: Secondary | ICD-10-CM

## 2024-07-17 DIAGNOSIS — E034 Atrophy of thyroid (acquired): Secondary | ICD-10-CM

## 2024-07-17 DIAGNOSIS — Z6841 Body Mass Index (BMI) 40.0 and over, adult: Secondary | ICD-10-CM | POA: Diagnosis not present

## 2024-07-17 DIAGNOSIS — Z0001 Encounter for general adult medical examination with abnormal findings: Secondary | ICD-10-CM

## 2024-07-17 DIAGNOSIS — Z1211 Encounter for screening for malignant neoplasm of colon: Secondary | ICD-10-CM

## 2024-07-17 DIAGNOSIS — Z8739 Personal history of other diseases of the musculoskeletal system and connective tissue: Secondary | ICD-10-CM

## 2024-07-17 DIAGNOSIS — G4733 Obstructive sleep apnea (adult) (pediatric): Secondary | ICD-10-CM

## 2024-07-17 DIAGNOSIS — E7800 Pure hypercholesterolemia, unspecified: Secondary | ICD-10-CM

## 2024-07-17 LAB — LIPID PANEL

## 2024-07-17 LAB — BAYER DCA HB A1C WAIVED: HB A1C (BAYER DCA - WAIVED): 5.6 % (ref 4.8–5.6)

## 2024-07-17 MED ORDER — AMLODIPINE BESY-BENAZEPRIL HCL 5-20 MG PO CAPS: ORAL_CAPSULE | ORAL | 3 refills | Status: AC

## 2024-07-17 MED ORDER — ATORVASTATIN CALCIUM 40 MG PO TABS: ORAL_TABLET | ORAL | 3 refills | Status: AC

## 2024-07-17 MED ORDER — LEVOTHYROXINE SODIUM 112 MCG PO TABS: 112.0000 ug | ORAL_TABLET | Freq: Every day | ORAL | 3 refills | Status: AC

## 2024-07-17 NOTE — Progress Notes (Signed)
 [name] [name] is a 53 y.o. male presents to office today for annual physical exam examination.    He reports he is doing well.  Insurance would not cover the Wegovy  but he still interested in weight loss as he has stalled out with diet and exercise.  He continues to be dependent on CPAP machine for OSA.  He had gel shots done in his left knee and that really has helped and he has been able to avoid further interventions for now.  Compliant with blood pressure, cholesterol and thyroid  medicines.  No chest pain, shortness of breath, dizziness, visual disturbance.  Has not had his colonoscopy and is not quite ready to have this done but will do it at some point.  Denies any rectal bleeding, unplanned weight loss or night sweats   Marital status: married, Substance use: rare ETOH Health Maintenance Due  Topic Date Due   Colonoscopy  Never done    Immunization History  Administered Date(s) Administered   PFIZER(Purple Top)SARS-COV-2 Vaccination 12/10/2019, 01/06/2020, 10/03/2020   Tdap 04/28/2013, 06/05/2023   Past Medical History:  Diagnosis Date   Clotting disorder    COVID-19 vaccine series completed 05/05/2020   Formatting of this note might be different from the original.  He received Pfizer vaccine on 12/10/19 & 01/06/2020     Hyperlipidemia    Hypertension    Social History   Socioeconomic History   Marital status: Married    Spouse name: Not on file   Number of children: Not on file   Years of education: Not on file   Highest education level: Not on file  Occupational History   Not on file  Tobacco Use   Smoking status: Never   Smokeless tobacco: Current    Types: Snuff  Vaping Use   Vaping status: Never Used  Substance and Sexual Activity   Alcohol use: No    Comment: 0CC   Drug use: No   Sexual activity: Yes  Other Topics Concern   Not on file  Social History Narrative   Not on file   Social Drivers of Health   Financial Resource Strain: Not on file  Food  Insecurity: Not on file  Transportation Needs: Not on file  Physical Activity: Not on file  Stress: Not on file  Social Connections: Unknown (02/11/2022)   Received from [facility]   Social Network    Social Network: Not on file  Intimate Partner Violence: Unknown (01/03/2022)   Received from [facility]   HITS    Physically Hurt: Not on file    Insult or Talk Down To: Not on file    Threaten Physical Harm: Not on file    Scream or Curse: Not on file   Past Surgical History:  Procedure Laterality Date   BICEPS TENDON REPAIR     KNEE SURGERY Left 2003   Family History  Problem Relation Age of Onset   Cancer Mother        breast cancer   Hypertension Mother    Heart attack Father    Diabetes Father    Hypertension Father     Current Outpatient Medications:    amLODipine -benazepril  (LOTREL) 5-20 MG capsule, TAKE 1 CAPSULE BY MOUTH EVERY DAY, Disp: 90 capsule, Rfl: 3   atorvastatin  (LIPITOR) 40 MG tablet, TAKE 1 TABLET (40 MG TOTAL) BY MOUTH DAILY., Disp: 90 tablet, Rfl: 3   colchicine  0.6 MG tablet, TAKE 2 TABS ON DAY 1 OF GOUT FLARE, MAY REPEAT 1  TAB IN 1 HOUR IF NEEDED. ON DAY 2, TAKE 1 TAB DAILY UNTIL GOUT FLARE RESOLVES, Disp: 90 tablet, Rfl: PRN   levothyroxine  (SYNTHROID ) 112 MCG tablet, Take 1 tablet (112 mcg total) by mouth daily., Disp: 90 tablet, Rfl: 3  Allergies  Allergen Reactions   Ibuprofen Anaphylaxis    FACIAL/ ORAL swelling     ROS: Review of Systems Pertinent items noted in HPI and remainder of comprehensive ROS otherwise negative.    Physical exam BP 124/79   Pulse 77   Temp 98.9 F (37.2 C) (Temporal)   Ht 5' 9 (1.753 m)   Wt 296 lb 6.4 oz (134.4 kg)   SpO2 97%   BMI 43.77 kg/m  General appearance: alert, cooperative, appears stated age, no distress, and morbidly obese Head: Normocephalic, without obvious abnormality, atraumatic Eyes: negative findings: lids and lashes normal, conjunctivae and sclerae normal, corneas clear, and pupils  equal, round, reactive to light and accomodation Ears: normal TM's and external ear canals both ears Nose: Nares normal. Septum midline. Mucosa normal. No drainage or sinus tenderness., has dimple at tip of nose Throat: lips, mucosa, and tongue normal; teeth and gums normal Neck: no adenopathy, no carotid bruit, supple, symmetrical, trachea midline, and thyroid  not enlarged, symmetric, no tenderness/mass/nodules Back: symmetric, no curvature. ROM normal. No CVA tenderness. Lungs: clear to auscultation bilaterally Chest wall: no tenderness Heart: regular rate and rhythm, S1, S2 normal, no murmur, click, rub or gallop Abdomen: soft, non-tender; bowel sounds normal; no masses,  no organomegaly and obese Extremities: extremities normal, atraumatic, no cyanosis or edema Pulses: 2+ and symmetric Skin: Skin color, texture, turgor normal. No rashes or lesions Lymph nodes: Cervical and supraclavicular lymph nodes normal Neurologic: Alert and oriented X 3, normal strength and tone. Normal symmetric reflexes. Normal coordination and gait      07/17/2024    2:56 PM 06/05/2023    7:55 AM 11/21/2022    3:46 PM  Depression screen PHQ 2/9  Decreased Interest 0 0 0  Down, Depressed, Hopeless 0 0 0  PHQ - 2 Score 0 0 0  Altered sleeping 0 0 0  Tired, decreased energy 0 0 0  Change in appetite 0 0 0  Feeling bad or failure about yourself  0 0 0  Trouble concentrating 0 0 0  Moving slowly or fidgety/restless 0 0 0  Suicidal thoughts 0 0 0  PHQ-9 Score 0 0 0  Difficult doing work/chores Not difficult at all Not difficult at all Not difficult at all      07/17/2024    2:56 PM 06/05/2023    7:55 AM 11/21/2022    3:45 PM 12/22/2021    3:07 PM  GAD 7 : Generalized Anxiety Score  Nervous, Anxious, on Edge 0 0 0 0  Control/stop worrying 0 0 0 0  Worry too much - different things 0 0 0 0  Trouble relaxing 0 0 0 0  Restless 0 0 0 0  Easily annoyed or irritable 0 0 0 0  Afraid - awful might happen 0 0 0 0   Total GAD 7 Score 0 0 0 0  Anxiety Difficulty Not difficult at all Not difficult at all Not difficult at all Not difficult at all    No results found for this or any previous visit (from the past 2160 hours).    Assessment/ Plan: [name] [name] [name] for annual physical exam.   Annual physical exam  Morbid obesity (HCC) - Plan: CMP14+EGFR, Lipid Panel, Bayer  DCA Hb A1c Waived, VITAMIN D  25 Hydroxy (Vit-D Deficiency, Fractures)  BMI 40.0-44.9, adult (HCC) - Plan: CMP14+EGFR, Lipid Panel, Bayer DCA Hb A1c Waived, VITAMIN D  25 Hydroxy (Vit-D Deficiency, Fractures)  Essential hypertension - Plan: CMP14+EGFR, amLODipine -benazepril  (LOTREL) 5-20 MG capsule  Moderate obstructive sleep apnea - Plan: CMP14+EGFR, CBC with Differential  Pure hypercholesterolemia - Plan: CMP14+EGFR, Lipid Panel, atorvastatin  (LIPITOR) 40 MG tablet  Hypothyroidism due to acquired atrophy of thyroid  - Plan: CMP14+EGFR, TSH + free T4, levothyroxine  (SYNTHROID ) 112 MCG tablet  History of gout - Plan: Uric Acid, CMP14+EGFR, CBC with Differential, colchicine  0.6 MG tablet  Screening for malignant neoplasm of prostate - Plan: PSA  Colon cancer screening - Plan: Ambulatory referral to Gastroenterology   He declined vaccinations today.  I have recommended colonoscopy and placed referral for him and reiterated that this should be something he prioritizes in the next 6 months as he is not had 1 before.  BMI is down from over 45 but still in morbidly obese category.  He is interested in Zepbound to for treatment of moderate obstructive sleep apnea as well as morbid obesity.  He will contact me via MyChart if this is something his insurance cover  Nonfasting labs collected today.  All medications have been renewed.  Blood pressure well-controlled.  Clinically euthyroid.  No gout flares.  No symptoms from prostate standpoint.  Patient's BMI is >30 mg/m2.  Patient's current BMI is Body mass index is 43.77 kg/m.SABRA   Patient is currently enrolled in a healthy eating plan along with encouraged exercise.  Patient has contraindications to phentermine, Contrave & Qsymia (contains phentermine).  Patient does not have a personal or family history of medullary thyroid  carcinoma (MTC) or Multiple Endocrine Neoplasia syndrome type 2 (MEN 2).   Counseled on healthy lifestyle choices, including diet (rich in fruits, vegetables and lean meats and low in salt and simple carbohydrates) and exercise (at least 30 minutes of moderate physical activity daily).  Patient to follow up 1 year for CPE  Daouda Lonzo M. Jolinda, DO

## 2024-07-18 LAB — CMP14+EGFR
ALT: 43 IU/L (ref 0–44)
AST: 23 IU/L (ref 0–40)
Albumin: 4.4 g/dL (ref 3.8–4.9)
Alkaline Phosphatase: 75 IU/L (ref 47–123)
BUN/Creatinine Ratio: 10 (ref 9–20)
BUN: 13 mg/dL (ref 6–24)
Bilirubin Total: 0.5 mg/dL (ref 0.0–1.2)
CO2: 21 mmol/L (ref 20–29)
Calcium: 9.4 mg/dL (ref 8.7–10.2)
Chloride: 105 mmol/L (ref 96–106)
Creatinine, Ser: 1.29 mg/dL — AB (ref 0.76–1.27)
Globulin, Total: 2.3 g/dL (ref 1.5–4.5)
Glucose: 95 mg/dL (ref 70–99)
Potassium: 4 mmol/L (ref 3.5–5.2)
Sodium: 143 mmol/L (ref 134–144)
Total Protein: 6.7 g/dL (ref 6.0–8.5)
eGFR: 66 mL/min/1.73 (ref >59)

## 2024-07-18 LAB — LIPID PANEL
Cholesterol, Total: 178 mg/dL (ref 100–199)
HDL: 35 mg/dL — AB (ref >39)
LDL CALC COMMENT:: 5.1 ratio — AB (ref 0.0–5.0)
LDL Chol Calc (NIH): 110 mg/dL — AB (ref 0–99)
Triglycerides: 188 mg/dL — AB (ref 0–149)
VLDL Cholesterol Cal: 33 mg/dL (ref 5–40)

## 2024-07-18 LAB — CBC WITH DIFFERENTIAL/PLATELET
Basophils Absolute: 0 x10E3/uL (ref 0.0–0.2)
Basos: 1 %
EOS (ABSOLUTE): 0.2 x10E3/uL (ref 0.0–0.4)
Eos: 3 %
Hematocrit: 44.8 % (ref 37.5–51.0)
Hemoglobin: 14.7 g/dL (ref 13.0–17.7)
Immature Grans (Abs): 0 x10E3/uL (ref 0.0–0.1)
Immature Granulocytes: 0 %
Lymphocytes Absolute: 2.7 x10E3/uL (ref 0.7–3.1)
Lymphs: 39 %
MCH: 29.5 pg (ref 26.6–33.0)
MCHC: 32.8 g/dL (ref 31.5–35.7)
MCV: 90 fL (ref 79–97)
Monocytes Absolute: 0.6 x10E3/uL (ref 0.1–0.9)
Monocytes: 9 %
Neutrophils Absolute: 3.3 x10E3/uL (ref 1.4–7.0)
Neutrophils: 48 %
Platelets: 206 x10E3/uL (ref 150–450)
RBC: 4.99 x10E6/uL (ref 4.14–5.80)
RDW: 14.4 % (ref 11.6–15.4)
WBC: 6.9 x10E3/uL (ref 3.4–10.8)

## 2024-07-18 LAB — TSH+FREE T4
Free T4: 1.04 ng/dL (ref 0.82–1.77)
TSH: 5.75 u[IU]/mL — AB (ref 0.450–4.500)

## 2024-07-18 LAB — PSA: Prostate Specific Ag, Serum: 0.5 ng/mL (ref 0.0–4.0)

## 2024-07-18 LAB — URIC ACID: Uric Acid: 8.7 mg/dL — AB (ref 3.8–8.4)

## 2024-07-18 LAB — VITAMIN D 25 HYDROXY (VIT D DEFICIENCY, FRACTURES): Vit D, 25-Hydroxy: 75.4 ng/mL (ref 30.0–100.0)

## 2024-07-19 ENCOUNTER — Encounter: Payer: Self-pay | Admitting: Family Medicine

## 2024-07-20 ENCOUNTER — Encounter: Payer: Self-pay | Admitting: Family Medicine

## 2024-07-20 ENCOUNTER — Telehealth: Payer: Self-pay | Admitting: Family Medicine

## 2024-07-20 ENCOUNTER — Ambulatory Visit: Payer: Self-pay | Admitting: Family Medicine

## 2024-07-20 DIAGNOSIS — G4733 Obstructive sleep apnea (adult) (pediatric): Secondary | ICD-10-CM

## 2024-07-20 MED ORDER — ZEPBOUND 2.5 MG/0.5ML ~~LOC~~ SOAJ: 2.5000 mg | SUBCUTANEOUS | 0 refills | Status: AC

## 2024-07-20 NOTE — Telephone Encounter (Signed)
 Name from pharmacy: ZEPBOUND 2.5 MG/0.5 ML PEN   Pharmacy comment: Alternative Requested:NOT COVERED; PLEASE CONTACT INSURANCE OR CONSIDER ALTERNATIVE.

## 2024-07-20 NOTE — Telephone Encounter (Signed)
 Please inform patient we are getting kick back that it is not covered.  Did they say they would cover for OSA?

## 2024-07-21 ENCOUNTER — Encounter (INDEPENDENT_AMBULATORY_CARE_PROVIDER_SITE_OTHER): Payer: Self-pay | Admitting: *Deleted

## 2024-07-21 ENCOUNTER — Encounter: Payer: Self-pay | Admitting: Family Medicine

## 2024-07-21 ENCOUNTER — Ambulatory Visit: Admitting: Family Medicine

## 2024-07-21 VITALS — BP 119/79 | HR 76 | Temp 97.6°F | Ht 68.0 in | Wt 300.2 lb

## 2024-07-21 DIAGNOSIS — B029 Zoster without complications: Secondary | ICD-10-CM | POA: Diagnosis not present

## 2024-07-21 MED ORDER — VALACYCLOVIR HCL 1 G PO TABS: 1000.0000 mg | ORAL_TABLET | Freq: Three times a day (TID) | ORAL | 0 refills | Status: AC

## 2024-07-21 NOTE — Progress Notes (Signed)
 Subjective: CC: Rash PCP: Jolinda Norene HERO, DO YEP:[name] [name] is a 53 y.o. male presenting to clinic today for:  Rash Patient reports development of a slightly itchy tingly rash on Saturday evening.  It developed into blisters and now is just inflamed and tender.  This is occurring on his left side.  He has used no medications for this so far.  No reported fevers etc.   ROS: Per HPI  Allergies  Allergen Reactions   Ibuprofen Anaphylaxis    FACIAL/ ORAL swelling   Past Medical History:  Diagnosis Date   Clotting disorder    COVID-19 vaccine series completed 05/05/2020   Formatting of this note might be different from the original.  He received Pfizer vaccine on 12/10/19 & 01/06/2020     Hyperlipidemia    Hypertension     Current Outpatient Medications:    amLODipine -benazepril  (LOTREL) 5-20 MG capsule, TAKE 1 CAPSULE BY MOUTH EVERY DAY, Disp: 90 capsule, Rfl: 3   atorvastatin  (LIPITOR) 40 MG tablet, TAKE 1 TABLET (40 MG TOTAL) BY MOUTH DAILY., Disp: 90 tablet, Rfl: 3   colchicine  0.6 MG tablet, TAKE 2 TABS ON DAY 1 OF GOUT FLARE, MAY REPEAT 1 TAB IN 1 HOUR IF NEEDED. ON DAY 2, TAKE 1 TAB DAILY UNTIL GOUT FLARE RESOLVES, Disp: 90 tablet, Rfl: PRN   levothyroxine  (SYNTHROID ) 112 MCG tablet, Take 1 tablet (112 mcg total) by mouth daily., Disp: 90 tablet, Rfl: 3   tirzepatide (ZEPBOUND) 2.5 MG/0.5ML Pen, Inject 2.5 mg into the skin once a week. (Patient not taking: Reported on 07/21/2024), Disp: 2 mL, Rfl: 0 Social History   Socioeconomic History   Marital status: Married    Spouse name: Not on file   Number of children: Not on file   Years of education: Not on file   Highest education level: Not on file  Occupational History   Not on file  Tobacco Use   Smoking status: Never   Smokeless tobacco: Current    Types: Snuff  Vaping Use   Vaping status: Never Used  Substance and Sexual Activity   Alcohol use: No    Comment: 0CC   Drug use: No   Sexual activity: Yes   Other Topics Concern   Not on file  Social History Narrative   Not on file   Social Drivers of Health   Financial Resource Strain: Not on file  Food Insecurity: Not on file  Transportation Needs: Not on file  Physical Activity: Not on file  Stress: Not on file  Social Connections: Unknown (02/11/2022)   Received from [facility]   Social Network    Social Network: Not on file  Intimate Partner Violence: Unknown (01/03/2022)   Received from [facility]   HITS    Physically Hurt: Not on file    Insult or Talk Down To: Not on file    Threaten Physical Harm: Not on file    Scream or Curse: Not on file   Family History  Problem Relation Age of Onset   Cancer Mother        breast cancer   Hypertension Mother    Heart attack Father    Diabetes Father    Hypertension Father     Objective: Office vital signs reviewed. BP 119/79   Pulse 76   Temp 97.6 F (36.4 C)   Ht 5' 8 (1.727 m)   Wt (!) 300 lb 4 oz (136.2 kg)   SpO2 96%   BMI 45.65  kg/m   Physical Examination:  General: Awake, alert, morbidly obese, No acute distress Skin: Erythematous, raised rash with various stages of vesicles in clusters along the left flank at approximately the T7 dermatome  Assessment/ Plan: 53 y.o. male   Herpes zoster without complication - Plan: valACYclovir (VALTREX) 1000 MG tablet   Clinically consistent with shingles rash.  Valtrex 1 g 3 times daily prescribed.  Reinforced recommendation for shingles vaccination in roughly 3 to 4 months for prevention of future recurrence.  Home care instructions reviewed including use of capsaicin topically if needed.  He will contact me via MyChart if needing anything like gabapentin etc.   Norene CHRISTELLA Fielding, DO [facility] [phone]

## 2024-07-21 NOTE — Patient Instructions (Signed)
 Shingles  Shingles, or herpes zoster, is an infection. It gives you a skin rash and blisters. These infected areas may hurt a lot. Shingles only happens if: You've had chickenpox. You've been given a shot called a vaccine to protect you from getting chickenpox. Shingles is rare in this case. What are the causes? Shingles is caused by a germ called the varicella-zoster virus. This is the same germ that causes chickenpox. After you're exposed to the germ, it stays in your body but is dormant. This means it isn't active. Shingles happens if the germ becomes active again. This can happen years after you're first exposed to the germ. What increases the risk? You may be more likely to get shingles if: You're older than 54 years of age. You're under a lot of stress. You have a weak immune system. The immune system is your body's defense system. It may be weak if: You have human immunodeficiency virus (HIV). You have acquired immunodeficiency syndrome (AIDS). You have cancer. You take medicines that weaken your immune system. These include organ transplant medicines. What are the signs or symptoms? The first symptoms of shingles may be itching, tingling, or pain. Your skin may feel like it's burning. A few days or weeks later, you'll get a rash. Here's what you can expect: The rash is likely to be on one side of your body. The rash may be shaped like a belt or a band. Over time, it will turn into blisters filled with fluid. The blisters will break open and change into scabs. The scabs will dry up in about 2-3 weeks. You may also have: A fever. Chills. A headache. Nausea. How is this diagnosed? Shingles is diagnosed with a skin exam. A sample called a culture may be taken from one of your blisters and sent to a lab. This will show if you have shingles. How is this treated? The rash may last for several weeks. There's no cure for shingles, but your health care provider may give you medicines.  These medicines may: Help with pain. Help with itching. Help with irritation and swelling. Help you get better sooner. Help to prevent long-term problems. If the rash is on your face, you may need to see an eye doctor or an ear, nose, and throat (ENT) doctor. Follow these instructions at home: Medicines Take your medicines only as told by your provider. Put an anti-itch cream or numbing cream on the rash or blisters as told by your provider. Relieving itching and discomfort  To help with itching: Put cold, wet cloths called cold compresses on the rash or blisters. Take a cool bath. Try adding baking soda or dry oatmeal to the water. Do not bathe in hot water. Use calamine lotion on the rash or blisters. You can get this type of lotion at the store. Blister and rash care Keep your rash covered with a loose bandage. Wear loose clothes that don't rub on your rash. Take care of your rash as told by your provider. Make sure you: Wash your hands with soap and water for at least 20 seconds before and after you change your bandage. If you can't use soap and water, use hand sanitizer. Keep your rash and blisters clean by washing them with mild soap and cool water. Change your bandage. Check your rash every day for signs of infection. Check for: More redness, swelling, or pain. Fluid or blood. Warmth. Pus or a bad smell. Do not scratch your rash. Do not pick at your  blisters. To help you not scratch: Keep your fingernails clean and cut short. Try to wear gloves or mittens when you sleep. General instructions Rest. Wash your hands often with soap and water for at least 20 seconds. If you can't use soap and water, use hand sanitizer. Washing your hands lowers your chance of getting a skin infection. Your infection can cause chickenpox in others. If you have blisters that aren't scabs yet, stay away from: Babies. Pregnant people. Children who have eczema. Older people who have organ  transplants. People who have a long-term, or chronic, illness. Anyone who hasn't had chickenpox before. Anyone who hasn't gotten the chickenpox vaccine. How is this prevented? Vaccines are the best way to prevent you from getting chickenpox or shingles. Talk with your provider about getting these shots. Where to find more information Centers for Disease Control and Prevention (CDC): TonerPromos.no Contact a health care provider if: Your pain doesn't get better with medicine. Your pain doesn't get better after the rash heals. You have any signs of infection around the rash. Your rash or blisters get worse. You have a fever or chills. Get help right away if: The rash is on your face or nose. You have pain in your face or by your eye. You lose feeling on one side of your face. You have trouble seeing. You have ear pain or ringing in your ear. This information is not intended to replace advice given to you by your health care provider. Make sure you discuss any questions you have with your health care provider. Document Revised: 06/20/2023 Document Reviewed: 11/02/2022 Elsevier Patient Education  2024 [employer].

## 2024-07-22 ENCOUNTER — Telehealth: Payer: Self-pay

## 2024-07-22 ENCOUNTER — Other Ambulatory Visit (HOSPITAL_COMMUNITY): Payer: Self-pay

## 2024-07-22 NOTE — Telephone Encounter (Signed)
 I do not see PA documentation in pt's chart, sending to them now.

## 2024-07-22 NOTE — Telephone Encounter (Signed)
 Pharmacy Patient Advocate Encounter   Received notification from Pt Calls Messages that prior authorization for Zepbound 2.5mg /0.38ml is required/requested.   Insurance verification completed.   The patient is insured through [employer].   Per test claim: Per test claim, medication is not covered due to plan/benefit exclusion, PA not submitted at this time     Placed a call to the insurance, and per the representative, Zepbound is not covered by the plan and is a plan exclusion.   Phone # [phone]

## 2024-07-23 NOTE — Progress Notes (Signed)
 [name] [name]                                          MRN: [id number]   07/23/2024   The [facility] Quality Team Specialist reviewed this patient medical record for the purposes of chart review for care gap closure. The following were reviewed: abstraction for care gap closure-controlling blood pressure.    [facility] Quality Team

## 2024-07-24 ENCOUNTER — Other Ambulatory Visit (HOSPITAL_COMMUNITY): Payer: Self-pay

## 2024-08-03 ENCOUNTER — Encounter: Payer: Self-pay | Admitting: Radiology

## 2024-10-05 ENCOUNTER — Ambulatory Visit: Payer: Self-pay

## 2024-10-05 NOTE — Telephone Encounter (Signed)
 noted

## 2024-10-05 NOTE — Telephone Encounter (Signed)
" °  FYI Only or Action Required?: FYI only for provider: ED advised.  Patient was last seen in primary care on 07/21/2024 by Jolinda Norene HERO, DO.  Called Nurse Triage reporting Dizziness and Blurred Vision.  Symptoms began a week ago.  Interventions attempted: Rest, hydration, or home remedies.  Symptoms are: gradually worsening.  Triage Disposition: Go to ED Now (Notify PCP)  Patient/caregiver understands and will follow disposition?: Yes  Copied from CRM [id number]. Topic: Clinical - Red Word Triage >> Oct 05, 2024  2:33 PM [name] [name] wrote: Red Word that prompted transfer to Nurse Triage: PT has Dizziness/Numbness both feet/Fatigue/for 2 weeks/Vision/Tingling gets worse at night Reason for Disposition  Loss of vision or double vision  (Exception: Similar to previous migraines.)  Answer Assessment - Initial Assessment Questions For the last 2 weeks- patient with episodes of dizziness/light headed sensation and blurry vision. Increased fatigue and bilateral foot numbness at night.  Reports mild SOB with recent respiratory illness. Still coughing up some. SOB does not correlate with dizziness.   Blurry vision occurs with dizziness. States it is like looking through dirty glasses and takes a few minutes to resolve fully. He does not have a way to check his BP. Endorses taking his BP meds consistently.  History of PE during COVID and attributed to that. Not on blood thinners at this time.   Frequent urination- has had 6 water bottles already today. Extensive family history of diabetes.   Advised patient needs to be seen in the ED for r/o TIA, CVA, PE, New onset DM with hyperglycemia, or hypertensive crisis.   1. DESCRIPTION: Describe your dizziness.     Light headed  2. LIGHTHEADED: Do you feel lightheaded? (e.g., somewhat faint, woozy, weak upon standing)     Feels faint and woozy  3. VERTIGO: Do you feel like either you or the room is spinning or tilting? (i.e., vertigo)      Denies 4. SEVERITY: How bad is it?  Do you feel like you are going to faint? Can you stand and walk?     Denies feeling like he is going to pass out  5. ONSET:  When did the dizziness begin?     Off and on for 2 weeks  6. AGGRAVATING FACTORS: Does anything make it worse? (e.g., standing, change in head position)     randomly 7. HEART RATE: Can you tell me your heart rate? How many beats in 15 seconds?  (Note: Not all patients can do this.)       Denies palpitations or elevated HR  8. CAUSE: What do you think is causing the dizziness? (e.g., decreased fluids or food, diarrhea, emotional distress, heat exposure, new medicine, sudden standing, vomiting; unknown)     Stays thirsty 9. RECURRENT SYMPTOM: Have you had dizziness before? If Yes, ask: When was the last time? What happened that time?     denies 10. OTHER SYMPTOMS: Do you have any other symptoms? (e.g., fever, chest pain, vomiting, diarrhea, bleeding)       Blurred vision  Protocols used: Dizziness - Lightheadedness-A-AH  "

## 2025-07-21 ENCOUNTER — Encounter: Payer: Self-pay | Admitting: Family Medicine
